# Patient Record
Sex: Female | Born: 1985
Health system: Southern US, Community
[De-identification: ages and names within clinical notes are randomized; demographics above are authoritative.]

## PROBLEM LIST (undated history)

## (undated) ENCOUNTER — Inpatient Hospital Stay (HOSPITAL_COMMUNITY): Payer: Self-pay

## (undated) DIAGNOSIS — B999 Unspecified infectious disease: Secondary | ICD-10-CM

## (undated) DIAGNOSIS — R87629 Unspecified abnormal cytological findings in specimens from vagina: Secondary | ICD-10-CM

## (undated) DIAGNOSIS — IMO0002 Reserved for concepts with insufficient information to code with codable children: Secondary | ICD-10-CM

## (undated) HISTORY — PX: NO PAST SURGERIES: SHX2092

---
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
## 2007-01-20 ENCOUNTER — Ambulatory Visit: Payer: Self-pay | Admitting: Sports Medicine

## 2007-04-21 ENCOUNTER — Ambulatory Visit: Payer: Self-pay | Admitting: Family Medicine

## 2007-05-03 ENCOUNTER — Ambulatory Visit: Payer: Self-pay | Admitting: Family Medicine

## 2007-05-03 ENCOUNTER — Encounter (INDEPENDENT_AMBULATORY_CARE_PROVIDER_SITE_OTHER): Payer: Self-pay | Admitting: Family Medicine

## 2007-05-03 ENCOUNTER — Telehealth: Payer: Self-pay | Admitting: *Deleted

## 2007-05-03 LAB — CONVERTED CEMR LAB
Bilirubin Urine: NEGATIVE
Glucose, Urine, Semiquant: NEGATIVE
Ketones, urine, test strip: NEGATIVE
Nitrite: NEGATIVE
Protein, U semiquant: 30
RBC / HPF: >20
Specific Gravity, Urine: 1.02
Urobilinogen, UA: 0.2
WBC, UA: >20 cells/hpf
pH: 7

## 2007-05-06 ENCOUNTER — Encounter (INDEPENDENT_AMBULATORY_CARE_PROVIDER_SITE_OTHER): Payer: Self-pay | Admitting: *Deleted

## 2007-06-02 ENCOUNTER — Encounter (INDEPENDENT_AMBULATORY_CARE_PROVIDER_SITE_OTHER): Payer: Self-pay | Admitting: Family Medicine

## 2007-06-02 ENCOUNTER — Ambulatory Visit: Payer: Self-pay | Admitting: Family Medicine

## 2007-06-02 LAB — CONVERTED CEMR LAB
Bilirubin Urine: NEGATIVE
Blood in Urine, dipstick: NEGATIVE
Chlamydia, DNA Probe: NEGATIVE
GC Probe Amp, Genital: NEGATIVE
Glucose, Urine, Semiquant: NEGATIVE
Ketones, urine, test strip: NEGATIVE
Nitrite: NEGATIVE
Protein, U semiquant: NEGATIVE
Specific Gravity, Urine: 1.025
Urobilinogen, UA: 0.2
WBC Urine, dipstick: NEGATIVE
Whiff Test: NEGATIVE
pH: 6

## 2007-06-03 ENCOUNTER — Encounter (INDEPENDENT_AMBULATORY_CARE_PROVIDER_SITE_OTHER): Payer: Self-pay | Admitting: Family Medicine

## 2007-07-13 ENCOUNTER — Encounter: Payer: Self-pay | Admitting: Family Medicine

## 2007-07-13 ENCOUNTER — Telehealth (INDEPENDENT_AMBULATORY_CARE_PROVIDER_SITE_OTHER): Payer: Self-pay | Admitting: *Deleted

## 2007-07-13 ENCOUNTER — Ambulatory Visit: Payer: Self-pay

## 2007-10-08 ENCOUNTER — Ambulatory Visit: Payer: Self-pay | Admitting: Family Medicine

## 2007-11-11 ENCOUNTER — Encounter: Payer: Self-pay | Admitting: *Deleted

## 2007-11-30 ENCOUNTER — Encounter: Payer: Self-pay | Admitting: Family Medicine

## 2007-11-30 ENCOUNTER — Other Ambulatory Visit: Admission: RE | Admit: 2007-11-30 | Discharge: 2007-11-30 | Payer: Self-pay | Admitting: Family Medicine

## 2007-11-30 ENCOUNTER — Ambulatory Visit: Payer: Self-pay | Admitting: Family Medicine

## 2007-11-30 LAB — CONVERTED CEMR LAB
Chlamydia, DNA Probe: NEGATIVE
GC Probe Amp, Genital: NEGATIVE
Whiff Test: NEGATIVE

## 2007-12-02 ENCOUNTER — Encounter: Payer: Self-pay | Admitting: Family Medicine

## 2007-12-02 LAB — CONVERTED CEMR LAB: Pap Smear: NORMAL

## 2007-12-03 ENCOUNTER — Encounter (INDEPENDENT_AMBULATORY_CARE_PROVIDER_SITE_OTHER): Payer: Self-pay | Admitting: Family Medicine

## 2008-01-03 ENCOUNTER — Ambulatory Visit: Payer: Self-pay | Admitting: Family Medicine

## 2008-04-04 ENCOUNTER — Ambulatory Visit: Payer: Self-pay | Admitting: Family Medicine

## 2008-04-04 LAB — CONVERTED CEMR LAB: Beta hcg, urine, semiquantitative: NEGATIVE

## 2008-08-02 ENCOUNTER — Ambulatory Visit: Payer: Self-pay | Admitting: Family Medicine

## 2008-08-02 LAB — CONVERTED CEMR LAB: Beta hcg, urine, semiquantitative: NEGATIVE

## 2008-12-25 ENCOUNTER — Ambulatory Visit: Payer: Self-pay | Admitting: Family Medicine

## 2008-12-25 ENCOUNTER — Encounter: Payer: Self-pay | Admitting: Family Medicine

## 2008-12-25 ENCOUNTER — Telehealth: Payer: Self-pay | Admitting: Family Medicine

## 2008-12-25 LAB — CONVERTED CEMR LAB
Beta hcg, urine, semiquantitative: NEGATIVE
Chlamydia, DNA Probe: NEGATIVE
GC Probe Amp, Genital: NEGATIVE
Whiff Test: NEGATIVE

## 2008-12-27 ENCOUNTER — Encounter: Payer: Self-pay | Admitting: Family Medicine

## 2009-01-12 ENCOUNTER — Encounter: Payer: Self-pay | Admitting: Family Medicine

## 2009-01-12 ENCOUNTER — Other Ambulatory Visit: Admission: RE | Admit: 2009-01-12 | Discharge: 2009-01-12 | Payer: Self-pay | Admitting: Family Medicine

## 2009-01-12 ENCOUNTER — Ambulatory Visit: Payer: Self-pay | Admitting: Family Medicine

## 2009-01-12 LAB — CONVERTED CEMR LAB
Beta hcg, urine, semiquantitative: NEGATIVE
Chlamydia, DNA Probe: NEGATIVE
GC Probe Amp, Genital: NEGATIVE
Whiff Test: POSITIVE

## 2009-01-23 ENCOUNTER — Encounter: Payer: Self-pay | Admitting: Family Medicine

## 2009-01-26 ENCOUNTER — Ambulatory Visit: Payer: Self-pay | Admitting: Family Medicine

## 2009-01-26 ENCOUNTER — Encounter: Payer: Self-pay | Admitting: Family Medicine

## 2009-01-26 LAB — CONVERTED CEMR LAB
ALT: 57 units/L — ABNORMAL HIGH (ref 0–35)
AST: 51 units/L — ABNORMAL HIGH (ref 0–37)
Albumin: 4.2 g/dL (ref 3.5–5.2)
Alkaline Phosphatase: 54 units/L (ref 39–117)
BUN: 7 mg/dL (ref 6–23)
Beta hcg, urine, semiquantitative: NEGATIVE
CO2: 21 meq/L (ref 19–32)
Calcium: 9.4 mg/dL (ref 8.4–10.5)
Chloride: 104 meq/L (ref 96–112)
Cholesterol: 196 mg/dL (ref 0–200)
Creatinine, Ser: 0.65 mg/dL (ref 0.40–1.20)
Glucose, Bld: 78 mg/dL (ref 70–99)
HDL: 58 mg/dL (ref >39)
LDL Cholesterol: 127 mg/dL — ABNORMAL HIGH (ref 0–99)
Potassium: 4 meq/L (ref 3.5–5.3)
Sodium: 138 meq/L (ref 135–145)
Total Bilirubin: 0.4 mg/dL (ref 0.3–1.2)
Total CHOL/HDL Ratio: 3.4
Total Protein: 7.5 g/dL (ref 6.0–8.3)
Triglycerides: 55 mg/dL (ref <150)
VLDL: 11 mg/dL (ref 0–40)

## 2009-01-29 ENCOUNTER — Telehealth: Payer: Self-pay | Admitting: *Deleted

## 2009-02-09 ENCOUNTER — Ambulatory Visit: Payer: Self-pay | Admitting: Family Medicine

## 2009-02-09 ENCOUNTER — Encounter (INDEPENDENT_AMBULATORY_CARE_PROVIDER_SITE_OTHER): Payer: Self-pay | Admitting: Family Medicine

## 2009-02-13 ENCOUNTER — Encounter (INDEPENDENT_AMBULATORY_CARE_PROVIDER_SITE_OTHER): Payer: Self-pay | Admitting: Family Medicine

## 2009-02-13 LAB — CONVERTED CEMR LAB
ALT: 18 units/L (ref 0–35)
AST: 15 units/L (ref 0–37)
Albumin: 4.4 g/dL (ref 3.5–5.2)
Alkaline Phosphatase: 53 units/L (ref 39–117)
Bilirubin, Direct: 0.1 mg/dL (ref 0.0–0.3)
HCV Ab: NEGATIVE
Hep B S Ab: POSITIVE — AB
Hepatitis B Surface Ag: NEGATIVE
Indirect Bilirubin: 0.2 mg/dL (ref 0.0–0.9)
Total Bilirubin: 0.3 mg/dL (ref 0.3–1.2)
Total Protein: 7.4 g/dL (ref 6.0–8.3)

## 2009-02-21 ENCOUNTER — Encounter: Payer: Self-pay | Admitting: Sports Medicine

## 2009-02-21 ENCOUNTER — Ambulatory Visit: Payer: Self-pay | Admitting: Family Medicine

## 2009-02-21 LAB — CONVERTED CEMR LAB
Beta hcg, urine, semiquantitative: NEGATIVE
Bilirubin Urine: NEGATIVE
Blood in Urine, dipstick: NEGATIVE
Chlamydia, DNA Probe: NEGATIVE
GC Probe Amp, Genital: NEGATIVE
Glucose, Urine, Semiquant: NEGATIVE
Ketones, urine, test strip: NEGATIVE
Nitrite: NEGATIVE
Protein, U semiquant: NEGATIVE
Specific Gravity, Urine: 1.015
Urobilinogen, UA: 0.2
WBC Urine, dipstick: NEGATIVE
Whiff Test: NEGATIVE
pH: 6

## 2009-03-01 ENCOUNTER — Ambulatory Visit: Payer: Self-pay | Admitting: Family Medicine

## 2009-03-02 ENCOUNTER — Encounter: Payer: Self-pay | Admitting: *Deleted

## 2009-03-02 ENCOUNTER — Encounter: Admission: RE | Admit: 2009-03-02 | Discharge: 2009-03-02 | Payer: Self-pay | Admitting: Family Medicine

## 2009-03-15 ENCOUNTER — Ambulatory Visit: Payer: Self-pay | Admitting: Family Medicine

## 2009-03-31 ENCOUNTER — Emergency Department (HOSPITAL_COMMUNITY): Admission: EM | Admit: 2009-03-31 | Discharge: 2009-03-31 | Payer: Self-pay | Admitting: Emergency Medicine

## 2009-04-10 ENCOUNTER — Telehealth: Payer: Self-pay | Admitting: Sports Medicine

## 2009-04-11 ENCOUNTER — Ambulatory Visit: Payer: Self-pay | Admitting: Family Medicine

## 2009-04-11 ENCOUNTER — Encounter: Payer: Self-pay | Admitting: Family Medicine

## 2009-04-12 LAB — CONVERTED CEMR LAB

## 2009-07-06 ENCOUNTER — Ambulatory Visit: Payer: Self-pay | Admitting: Family Medicine

## 2009-07-06 ENCOUNTER — Telehealth (INDEPENDENT_AMBULATORY_CARE_PROVIDER_SITE_OTHER): Payer: Self-pay | Admitting: *Deleted

## 2009-07-06 LAB — CONVERTED CEMR LAB: Whiff Test: NEGATIVE

## 2009-08-01 ENCOUNTER — Ambulatory Visit: Payer: Self-pay | Admitting: Family Medicine

## 2009-08-01 LAB — CONVERTED CEMR LAB: Whiff Test: NEGATIVE

## 2009-08-08 ENCOUNTER — Ambulatory Visit: Payer: Self-pay | Admitting: Family Medicine

## 2009-08-08 ENCOUNTER — Encounter: Payer: Self-pay | Admitting: Sports Medicine

## 2009-08-08 ENCOUNTER — Encounter: Admission: RE | Admit: 2009-08-08 | Discharge: 2009-08-08 | Payer: Self-pay | Admitting: Sports Medicine

## 2009-08-08 LAB — CONVERTED CEMR LAB
Basophils Absolute: 0 10*3/uL (ref 0.0–0.1)
Basophils Relative: 0 % (ref 0–1)
CMV IgM: <8 (ref <30.0)
Cytomegalovirus Ab-IgG: 5.9 — ABNORMAL HIGH (ref <0.4)
EBV VCA IgG: >7 — ABNORMAL HIGH
EBV VCA IgM: 0.17
Eosinophils Absolute: 0.1 10*3/uL (ref 0.0–0.7)
Eosinophils Relative: 1 % (ref 0–5)
HCT: 32 % — ABNORMAL LOW (ref 36.0–46.0)
Hemoglobin: 9.7 g/dL — ABNORMAL LOW (ref 12.0–15.0)
Lymphocytes Relative: 34 % (ref 12–46)
Lymphs Abs: 2.3 10*3/uL (ref 0.7–4.0)
MCHC: 30.3 g/dL (ref 30.0–36.0)
MCV: 73.7 fL — ABNORMAL LOW (ref 78.0–100.0)
Monocytes Absolute: 0.6 10*3/uL (ref 0.1–1.0)
Monocytes Relative: 8 % (ref 3–12)
Neutro Abs: 3.8 10*3/uL (ref 1.7–7.7)
Neutrophils Relative %: 57 % (ref 43–77)
Platelets: 363 10*3/uL (ref 150–400)
RBC: 4.34 M/uL (ref 3.87–5.11)
RDW: 16.4 % — ABNORMAL HIGH (ref 11.5–15.5)
Sed Rate: 20 mm/hr (ref 0–22)
Toxoplasma Antibody- IgM: 0.3
Toxoplasma IgG Ratio: <0.5
WBC: 6.7 10*3/uL (ref 4.0–10.5)

## 2009-08-22 ENCOUNTER — Ambulatory Visit: Payer: Self-pay | Admitting: Family Medicine

## 2009-08-22 LAB — CONVERTED CEMR LAB: Beta hcg, urine, semiquantitative: NEGATIVE

## 2009-08-23 ENCOUNTER — Telehealth: Payer: Self-pay | Admitting: Sports Medicine

## 2009-11-13 ENCOUNTER — Ambulatory Visit: Payer: Self-pay | Admitting: Family Medicine

## 2010-01-18 ENCOUNTER — Ambulatory Visit: Payer: Self-pay | Admitting: Family Medicine

## 2010-02-07 ENCOUNTER — Ambulatory Visit: Payer: Self-pay | Admitting: Family Medicine

## 2010-05-01 ENCOUNTER — Encounter: Payer: Self-pay | Admitting: *Deleted

## 2010-08-19 ENCOUNTER — Encounter: Payer: Self-pay | Admitting: Sports Medicine

## 2010-11-12 NOTE — Assessment & Plan Note (Signed)
Summary: No Show/Depo  Nurse Visit   Allergies: No Known Drug Allergies

## 2010-11-12 NOTE — Miscellaneous (Signed)
  Clinical Lists Changes  Problems: Removed problem of DENTAL PAIN (ICD-525.9) Removed problem of LYMPHADENOPATHY (ICD-785.6) Removed problem of BREAST PAIN, LEFT (ICD-611.71) Removed problem of PELVIC  PAIN (ICD-789.09) Removed problem of LIVER FUNCTION TESTS, ABNORMAL, HX OF (ICD-V12.2) Removed problem of ASCUS PAP (ICD-795.01) Removed problem of ROUTINE GYNECOLOGICAL EXAMINATION (ICD-V72.31) Removed problem of SCREENING FOR MALIGNANT NEOPLASM OF THE CERVIX (ICD-V76.2) Removed problem of CONTACT OR EXPOSURE TO OTHER VIRAL DISEASES (ICD-V01.79) Removed problem of CONTRACEPTIVE MANAGEMENT (ICD-V25.09)

## 2010-11-12 NOTE — Assessment & Plan Note (Signed)
 Summary: f/u u/s/eo   Vital Signs:  Patient profile:   25 year old female Weight:      139.7 pounds Temp:     98.1 degrees F Pulse rate:   67 / minute BP sitting:   99 / 68  Vitals Entered By: Letitia Reusing (March 15, 2009 3:50 PM) CC: f/u   Primary Care Provider:  LUDIE LITTLER MD  CC:  f/u.  History of Present Illness: 68F for fu of pelvic pain at previous visits.    Pain is now even better than previous visit.  Doesn't even bother pt now.  Had pelvic and transvaginal US  that was neg for any lesions.  Taking ibuprofen  and it still helps.  Neg GC/Chlam, UA, wet prep, KOH.  Description of pain from 5/10.  Described as sharp/stabbing pain that is worse in the mornings after her ibuprofen  has worn off.  It is present bilaterally, no radiation.  No known association with menses as she is on depo povera regularly, last injection 1.5 months ago and documented in our system.  Occasional hot flashes but no fevers.  No N/V/C/D, stooling two times a day and soft.  No blood in stool.  No vaginal discharge, no bleeding, no itching.  Pain is associated with urinary frequency.  When she does urinate, only a small amount comes out.  No burning or pain when she urinates.  No foul smell or changes in color.  Pt is sexually active with one female, uses condoms intermittently, never had an STD but has been tested before.  No known family history of endometritis or ovarian cysts.  Negative UA, Upreg, GC/Chlam and wet prep at initial visit.  Better with ibuprofen .  Allergies: No Known Drug Allergies  Past History:  Past Medical History: Last updated: 08/02/2008 H/o eczema  Past Surgical History: Last updated: 08/02/2008 None  Family History: Last updated: 08/02/2008 Mother and father living and healthy  Social History: Last updated: 01/12/2009 MARLOW Parsley, studying business. Occupation: Print Production Planner, sexually active Never Smoked Alcohol use-no Drug use-no Regular exercise-yes  Review  of Systems       See HPI  Physical Exam  General:  Well-developed,well-nourished,in no acute distress; alert,appropriate and cooperative throughout examination Lungs:  Normal respiratory effort, chest expands symmetrically. Lungs are clear to auscultation, no crackles or wheezes. Heart:  Normal rate and regular rhythm. S1 and S2 normal without gallop, murmur, click, rub or other extra sounds. Abdomen:  Bowel sounds positive,abdomen soft and non-tender without masses, organomegaly or hernias noted.   Impression & Recommendations:  Problem # 1:  PELVIC  PAIN (ICD-789.09) Assessment Improved Starting to appear more and more like menstrual cramping type pain.  Workup negative.  Still would consider endometritis if comes with periods however she has not had one since her depo shot.  She will come back in 2 months and let me know if pain happens with cycle.  No more depo, will call if any red flags.  Her updated medication list for this problem includes:    Ibuprofen  800 Mg Tabs (Ibuprofen ) ..... One tab by mouth q6h as needed for pain.  Orders: [facility]- Est Level  3 (00786)  Complete Medication List: 1)  Ibuprofen  800 Mg Tabs (Ibuprofen ) .... One tab by mouth q6h as needed for pain. 2)  Doc-q-lace 100 Mg Caps (Docusate sodium ) .... One tab by mouth bid  Patient Instructions: 1)  Great to see you today, 2)  Glad to hear that the pain is less and not  bothering you anymore.  Keep taking the ibuprofen  and start taking the stool softener. 3)  Come back to see me in 2 months so that we can see if the pain has started to come and go with your menstrual cycles.  Do not get any more Depo provera  shots for now. 4)  Come back earlier if you have severe pain, severe bleeding (not like a period) or any other major concerns. 5)  -Dr. ONEIDA.

## 2010-11-12 NOTE — Assessment & Plan Note (Signed)
 Summary: uti s/s   Vital Signs:  Patient profile:   25 year old female Weight:      140.2 pounds Temp:     98.0 degrees F oral Pulse rate:   71 / minute BP sitting:   102 / 66  (left arm)  Vitals Entered By: Letitia Reusing (Feb 21, 2009 3:42 PM) CC: Pelvic pain, urinary freq Is Patient Diabetic? No   History of Present Illness: 71 F comes in with complaints of pelvic pain.  Pain has been going on for 3 days now, has never had pain similar to this before  5/10.  Described as sharp/stabbing pain that is worse in the mornings after her ibuprofen  has worn off.  It is present bilaterally, no radiation.  No known association with menses as she is on depo povera regularly, last injection 1 month ago and documented in our system.  Occasional hot flashes but no fevers.  No N/V/C/D, stooling two times a day and soft.  No blood in stool.  No vaginal discharge, no bleeding, no itching.  Pain is associated with urinary frequency.  When she does urinate, only a small amount comes out.  No burning or pain when she urinates.  No foul smell or changes in color.  Pt is sexually active with one female, uses condoms intermittently, never had an STD but has been tested before.  No known family history of endometritis or ovarian cysts.  Habits & Providers     Tobacco Status: never  Allergies: No Known Drug Allergies  Past History:  Past Medical History:    H/o eczema (08/02/2008)  Past Surgical History:    None (08/02/2008)  Family History:    Mother and father living and healthy (08/02/2008)  Social History:    MARLOW Parsley, studying business.    Occupation: consulting civil engineer    Single, sexually active    Never Smoked    Alcohol use-no    Drug use-no    Regular exercise-yes     (01/12/2009)  Physical Exam  General:  Well-developed,well-nourished,in no acute distress; alert,appropriate and cooperative throughout examination Lungs:  Normal respiratory effort, chest expands symmetrically. Lungs are  clear to auscultation, no crackles or wheezes. Heart:  Normal rate and regular rhythm. S1 and S2 normal without gallop, murmur, click, rub or other extra sounds. Abdomen:  Bowel sounds positive,abdomen soft and without masses, organomegaly or hernias noted.  Mildly tender in LLQ and suprapubic, stool felt in LLQ and suprapubic.  No rebound tenderness.   Genitalia:  Normal introitus for age, no external lesions, no vaginal discharge, mucosa pink and moist, no vaginal or cervical lesions, no vaginal atrophy, no friaility or hemorrhage, normal uterus size and position, no adnexal masses or tenderness Extremities:  No clubbing, cyanosis, edema, or deformity noted with normal full range of motion of all joints.   Skin:  Intact without suspicious lesions or rashes   Impression & Recommendations:  Problem # 1:  PELVIC  PAIN (ICD-789.09) Differential is quite wide.  Negative wet prep, Urinalysis, pelvic exam, neg pregnancy test and no association with menses.  Differentials include Ovarian cysts, mittelschmerz, endomeritis, constipation, PID.   Unlikely Cystitis with negative UA.  With complaints of frequency/ugency, also should consider urge incontinence/overactve bladder even though she has not had any leakage.  Will have her come back in one week to see me to reasses pain.  This will also give time for the GC/Chlam to come back.  If she is still having pain, wlll start a  stool softener and get a pelvic/transvaginal ultrasound.  Would also stop Depo as it confounds whether this is associated with her menses.  Orders: U Preg-[facility] (81025) [facility]- Est  Level 4 (99214)  Other Orders: Urinalysis-[facility] (00000) GC/Chlamydia-[facility] (87591/87491) [facility] [phone])  Patient Instructions: 1)  Great to meet you today, 2)  Because some of the tests can take some time to come back, I want you to come back to see me NEXT WEEK as it can take a couple of days for the results to come back.  We will go over your results.   If you are still having pain then we can check the ultrasound at that time.  Keep taking the ibuprofen  for your symptoms for now. 3)  -Dr. ONEIDA.  Laboratory Results   Urine Tests  Date/Time Received: Feb 21, 2009 3:42 PM  Date/Time Reported: Feb 21, 2009 3:49 PM   Routine Urinalysis   Color: yellow Appearance: Clear Glucose: negative   (Normal Range: Negative) Bilirubin: negative   (Normal Range: Negative) Ketone: negative   (Normal Range: Negative) Spec. Gravity: 1.015   (Normal Range: 1.003-1.035) Blood: negative   (Normal Range: Negative) pH: 6.0   (Normal Range: 5.0-8.0) Protein: negative   (Normal Range: Negative) Urobilinogen: 0.2   (Normal Range: 0-1) Nitrite: negative   (Normal Range: Negative) Leukocyte Esterace: negative   (Normal Range: Negative)    Urine HCG: negative Comments: ...........test performed by...........SABRAArland Morel, CMA  Date/Time Received: Feb 21, 2009 4:32 PM  Date/Time Reported: Feb 21, 2009 4:39 PM   [employer] Source: vaginal WBC/hpf: 1-5 Bacteria/hpf: 3+  Rods Clue cells/hpf: none  Negative whiff Yeast/hpf: none Trichomonas/hpf: none Comments: ...........test performed by............SABRALucita Hinds MD 4:33PM

## 2010-11-12 NOTE — Assessment & Plan Note (Signed)
Summary: depo,df  Nurse Visit   Allergies: No Known Drug Allergies  Medication Administration  Injection # 1:    Medication: Depo-Provera 150mg     Diagnosis: CONTRACEPTIVE MANAGEMENT (ICD-V25.09)    Route: IM    Site: RUOQ gluteus    Exp Date: 02/2012    Lot #: [id number]    Mfr: [email or web address]    Comments: next depo due July 14 thru May 09, 2010    Patient tolerated injection without complications    Given by: Theresia Lo RN (February 07, 2010 2:44 PM)  Orders Added: 1)  Depo-Provera 150mg  [J1055] 2)  Admin of Injection (IM/SQ) [04540]   Medication Administration  Injection # 1:    Medication: Depo-Provera 150mg     Diagnosis: CONTRACEPTIVE MANAGEMENT (ICD-V25.09)    Route: IM    Site: RUOQ gluteus    Exp Date: 02/2012    Lot #: [id number]    Mfr: [email or web address]    Comments: next depo due July 14 thru May 09, 2010    Patient tolerated injection without complications    Given by: Theresia Lo RN (February 07, 2010 2:44 PM)  Orders Added: 1)  Depo-Provera 150mg  [J1055] 2)  Admin of Injection (IM/SQ) [47829]

## 2010-11-12 NOTE — Assessment & Plan Note (Signed)
Summary: ? infected tooth,tcb   Vital Signs:  Patient profile:   25 year old female Weight:      146.5 pounds Temp:     98.6 degrees F oral Pulse rate:   88 / minute BP sitting:   103 / 64  (right arm) Cuff size:   regular  Vitals Entered By: Loralee Pacas CMA (January 18, 2010 3:24 PM) CC: infected tooth   Primary Care Provider:  Rodney Langton MD  CC:  infected tooth.  History of Present Illness: 25 y/o female with 4 days of dental pain. s/p removal of all wisdom teeth last year. +TTP, facial swelling. no fever/chills. some difficulty eating solids. tried 800 mg ibuprofen with some relief of pain. dental appt scheduled 4/21. pain at R jaw, last molar.   Current Medications (verified): 1)  Ibuprofen 800 Mg Tabs (Ibuprofen) .... One Tab By Mouth Q6h As Needed For Pain. 2)  Doc-Q-Lace 100 Mg Caps (Docusate Sodium) .... One Tab By Mouth Bid 3)  Penicillin V Potassium 500 Mg Tabs (Penicillin V Potassium) .... One Tab By Mouth Qid X10 Days. 4)  Ultram 50 Mg Tabs (Tramadol Hcl) .... One Tab By Mouth Q6 Hours As Needed Pain  Allergies (verified): No Known Drug Allergies  Physical Exam  General:  well appearing female NAD. vitals reviewed.  Mouth:  good dentition.  +erythema and exquisite TTP near buccal surface of R mandibular molar. mild edema of face near area of molar.    Impression & Recommendations:  Problem # 1:  DENTAL PAIN (ICD-525.9) Assessment New  concerning for infection. rx for penicillin and ultram for pain. red flags given.   Orders: [facility]- Est Level  3 (16109)  Patient Instructions: 1)  If you develop fever, worsening facial swelling or redness, or other concerns, please call our office 2)  Keep your appointment with the dentist Prescriptions: ULTRAM 50 MG TABS (TRAMADOL HCL) one tab by mouth q6 hours as needed pain  #100 x 0   Entered and Authorized by:   Lequita Asal  MD   Signed by:   Lequita Asal  MD on 01/18/2010   Method used:    Electronically to        [id number]* (retail)       [address] [place], [place]  [address]       Ph: [phone]       Fax: [phone]   RxID:   [id number] PENICILLIN V POTASSIUM 500 MG TABS (PENICILLIN V POTASSIUM) one tab by mouth qid x10 days.  #40 x 0   Entered and Authorized by:   Lequita Asal  MD   Signed by:   Lequita Asal  MD on 01/18/2010   Method used:   Electronically to        [id number]* (retail)       [address] [place], [place]  [address]       Ph: [phone]       Fax: [phone]   RxID:   [id number]

## 2010-11-12 NOTE — Assessment & Plan Note (Signed)
 Summary: f/u visit & knot under throat/[facility]   Vital Signs:  Patient profile:   25 year old female Height:      67 inches Weight:      140 pounds BMI:     22.01 Temp:     98.4 degrees F Pulse rate:   80 / minute BP sitting:   103 / 69  (right arm) Cuff size:   regular  Vitals Entered By: Nathanel Saba RN (August 08, 2009 3:13 PM) CC: POssible cyst under chin Is Patient Diabetic? No Pain Assessment Patient in pain? no        Primary Care Provider:  Debby Petties MD  CC:  POssible cyst under chin.  History of Present Illness: 66F with no sig PMHx here for contraceptive mgt and new lesion under chin.  Contraceptive mgt:  last depo shot was 4/10, was due for another 7/10 but missed, will need to check upreg today and in 2 weeks, then can give depo.  Lesion under chin:  history briefly, this patient has had recurrent vaginal candidiasis over 2 years, recurrent lymphadenopathy in both groins, and several cervical sites, and now just under the chin over the past 6 months.  LAD was initially painful and then non-tender, and nodes have always been freely mobile.  She also had an episode of a desquamative rash on her palms and soles several months ago but had a negative RPR.  She has never had fevers/chills, no night sweats, no weight loss, no cough, no fatigue.  Lymph nodes have never drained.  In the past she has had Hep B and C titers done that were negative, HIV that was neg in 2008, multiple positive wet preps (for fungus) that were tx with diflucan , flagyl for BV, a CMET that was negative for any abnormalities or hyperglycemia.  The lesion today does not bother her, she just is curious as to what it is.   There is no family history of immune problems, blood cancer, or sarcoid.  She is sexually active with oen partner but does have unprotected sex.  Habits & Providers  Alcohol-Tobacco-Diet     Tobacco Status: never     Tobacco Counseling: not indicated; no tobacco  use  Allergies: No Known Drug Allergies  Past History:  Past Medical History: Last updated: 08/02/2008 H/o eczema  Past Surgical History: Last updated: 08/02/2008 None  Family History: Last updated: 08/02/2008 Mother and father living and healthy  Social History: Last updated: 01/12/2009 MARLOW Parsley, studying business. Occupation: Print Production Planner, sexually active Never Smoked Alcohol use-no Drug use-no Regular exercise-yes  Review of Systems       See HPI  Physical Exam  General:  Well-developed,well-nourished,in no acute distress; alert,appropriate and cooperative throughout examination Head:  Normocephalic and atraumatic without obvious abnormalities. No apparent alopecia or balding. Eyes:  No corneal or conjunctival inflammation noted. EOMI. Perrla. Ears:  External ear exam shows no significant lesions or deformities.   Nose:  External nasal examination shows no deformity or inflammation. Mouth:  Whitish plaque over tongue but not on sides of mouth. No pharyngeal erythema Neck:  No deformities, masses, or tenderness noted.  See lymph node exam. Lungs:  Normal respiratory effort, chest expands symmetrically. Lungs are clear to auscultation, no crackles or wheezes. Heart:  Normal rate and regular rhythm. S1 and S2 normal without gallop, murmur, click, rub or other extra sounds. Abdomen:  Bowel sounds positive,abdomen soft and non-tender without masses, organomegaly or hernias noted. Extremities:  No clubbing, cyanosis, edema,  or deformity noted with normal full range of motion of all joints.   Skin:  Intact without suspicious lesions or rashes Cervical Nodes:  Several sites of non-tender lymph nodes palpable, both L and R jugulodigastric nodes palpable, cystic masses approx 1cm across under the chin immediately to the left and right of midline that doesn't move with tongue protrusion.  One palpable 7mm node right, posterior cervical chain.  Soft, well defined, and  mobile. Axillary Nodes:  No palpable lymphadenopathy Inguinal Nodes:  Left and right, 5mm palpable, non-tender nodes.  Soft, well defined, and mobile. Additional Exam:  KOH swab of whitish plaque on tongue is negative.   Impression & Recommendations:  Problem # 1:  LYMPHADENOPATHY (ICD-785.6) Assessment New Large Ddx includes HIV, syphilis, CMV, EBV, Toxo, Hodgkins vs Follicular/non-Hodgkins lymphoma, sarcoidosis.  Will test initially for all of these with the below labs.  Could be reactive nodes but would prefer to r/o more serious etiologies.  No B-symptoms.  Orders: CBC w/Diff-[facility] (14974) HIV-[facility] ([id number]) KOH-[facility] (12779) Sed Rate (ESR)-[facility] [id number]) Miscellaneous Lab Charge-[facility] [phone]) (CMV, EBV, Toxo IgG and IgM) RPR-[facility] ([id number]) CXR- 2view (CXR) [facility]- Est  Level 4 (00785)  Problem # 2:  CONTRACEPTIVE MANAGEMENT (ICD-V25.09) Assessment: Unchanged  Checking Upreg today, in 2 weeks as well, and if neg can do depo provera  150mg  IM x1 q3months.  Orders: U Preg-[facility] (81025)  Complete Medication List: 1)  Ibuprofen  800 Mg Tabs (Ibuprofen ) .... One tab by mouth q6h as needed for pain. 2)  Doc-q-lace 100 Mg Caps (Docusate sodium ) .... One tab by mouth bid  Patient Instructions: 1)  Good to see you today, 2)  I will be checking some more bloodwork and a chest Xray. 3)  Come back to see me as needed.  I will call you if anything is abnormal on your labs, some of the labs could take 2 weeks to come back. 4)  -Dr. ONEIDA.  Appended Document: lab reports    Lab Visit  Laboratory Results   Urine Tests  Date/Time Received: August 08, 2009 3:51 PM  Date/Time Reported: August 08, 2009 4:35 PM     Urine HCG: negative Comments: ...............test performed by......SABRABonnie A. Jordan, [place] (ASCP)  Date/Time Received: August 08, 2009 3:51 PM  Date/Time Reported: August 08, 2009 4:35 PM   Other Tests  Skin KOH: Negative Comments: ...............test performed  by......SABRABonnie A. Jordan, MT (ASCP)   Orders Today:

## 2010-11-12 NOTE — Progress Notes (Signed)
 Summary: Triage  Phone Note Call from Patient Call back at Home Phone [phone]   Caller: Patient Summary of Call: chest pain for about a week and dark spots on legs.   Initial call taken by: Madelin Daring,  April 10, 2009 2:16 PM  Follow-up for Phone Call        c/o chest pain worse in 1 week. comes & goes. located top of  L breast. nothing makes it better or worse. has not taken any meds for this. lasts 2 minutes. has not been seen for this before non-smoker c/o dark spots on upper legs x 3 weeks. denies bug bites. only on thigh area of legs. no pain. reviewed signs of MI with her & told her if any occur, call 911 & go to ED. appt made for 10am tomorrow Follow-up by: Ginnie Mau RN,  April 10, 2009 2:36 PM  Additional Follow-up for Phone Call Additional follow up Details #1::        Good, I am doing work-ins tomorrow so hopefully I can see her. Additional Follow-up by: Debby Petties MD,  April 10, 2009 11:07 PM

## 2010-11-12 NOTE — Progress Notes (Signed)
 Summary: triage  Phone Note Call from Patient Call back at [phone]   Caller: Patient Summary of Call: pt wants to get something called in for yeast inf.  Pt uses [facility]  Initial call taken by: Madelin Daring,  August 23, 2009 4:33 PM  Follow-up for Phone Call        offered appt. she states md told her to call if it re-occured & he would send more meds to pharmacy. to pcp Follow-up by: Ginnie Mau RN,  August 24, 2009 8:50 AM  Additional Follow-up for Phone Call Additional follow up Details #1::        Will do chronic suppressive therapy. Please notify patient. Additional Follow-up by: Debby Petties MD,  August 24, 2009 12:09 PM    New/Updated Medications: DIFLUCAN  150 MG TABS (FLUCONAZOLE ) One tab by mouth q72h x 3 doses, then qweekly for 6 months. Prescriptions: DIFLUCAN  150 MG TABS (FLUCONAZOLE ) One tab by mouth q72h x 3 doses, then qweekly for 6 months.  #27 x 0   Entered and Authorized by:   Debby Petties MD   Signed by:   Debby Petties MD on 08/24/2009   Method used:   Electronically to        [id number]* (retail)       [address] [place], [place]  [address]       Ph: [phone]       Fax: [phone]   RxID:   [id number]   Appended Document: triage pt informed

## 2010-11-12 NOTE — Assessment & Plan Note (Signed)
 Summary: 1:30 f/u per Dr. ONEIDA.   Vital Signs:  Patient profile:   25 year old female Weight:      139.9 pounds Temp:     97.5 degrees F oral Pulse rate:   88 / minute BP sitting:   111 / 71  (left arm)  Vitals Entered By: Letitia Reusing (Mar 01, 2009 1:39 PM)  CC: f/u Is Patient Diabetic? No   History of Present Illness: 74 F comes in for follow up of complaints of pelvic pain.  Symptoms similar to before but slightly better, she had her period and the pain started to get a little better.  Quality from previous exam briefly:   5/10.  Described as sharp/stabbing pain that is worse in the mornings after her ibuprofen  has worn off.  It is present bilaterally, no radiation.  No known association with menses as she is on depo povera regularly, last injection 1.5 months ago and documented in our system.  Occasional hot flashes but no fevers.  No N/V/C/D, stooling two times a day and soft.  No blood in stool.  No vaginal discharge, no bleeding, no itching.  Pain is associated with urinary frequency.  When she does urinate, only a small amount comes out.  No burning or pain when she urinates.  No foul smell or changes in color.  Pt is sexually active with one female, uses condoms intermittently, never had an STD but has been tested before.  No known family history of endometritis or ovarian cysts.  Negative UA, Upreg, GC/Chlam and wet prep at initial visit.  Better with ibuprofen .  Habits & Providers     Tobacco Status: never  Allergies: No Known Drug Allergies  Past History:  Past Medical History:    H/o eczema (08/02/2008)  Past Surgical History:    None (08/02/2008)  Family History:    Mother and father living and healthy (08/02/2008)  Social History:    Susan Solomon, studying business.    Occupation: consulting civil engineer    Single, sexually active    Never Smoked    Alcohol use-no    Drug use-no    Regular exercise-yes     (01/12/2009)  Review of Systems       See HPI  Physical  Exam  General:  Well-developed,well-nourished,in no acute distress; alert,appropriate and cooperative throughout examination Abdomen:  Bowel sounds positive,abdomen soft and without masses, organomegaly or hernias noted.  TTP RLQ and LLQ no rebounding, no guarding.   Impression & Recommendations:  Problem # 1:  PELVIC  PAIN (ICD-789.09) Assessment Improved Pain similar to last visit.  Workup will continue with pelvic/transvaginal ultrasound.  If negative will do pelvic MRI.  Pt to stop depo-provera  so as not to confuse symptoms with menstruation.  Will refill ibuprofen .  RTC one week.  Her updated medication list for this problem includes:    Ibuprofen  800 Mg Tabs (Ibuprofen ) ..... One tab by mouth q6h as needed for pain.  Orders: [facility]- Est Level  3 (00786) Ultrasound (Ultrasound)  Complete Medication List: 1)  Ibuprofen  800 Mg Tabs (Ibuprofen ) .... One tab by mouth q6h as needed for pain. 2)  Doc-q-lace 100 Mg Caps (Docusate sodium ) .... One tab by mouth bid  Patient Instructions: 1)  Great to see you today, 2)  Because you are still having the pain, I'd like to check an ultrasound.  My nurse will give you the information to make an appointment to get the US . 3)  Please stop your Depo povera for  now so that we can start to time it with your periods. 4)  I will refill your ibuprofin. 5)  See me again in one week to go over results. 6)  -Dr. ONEIDA. Prescriptions: DOC-Q-LACE 100 MG CAPS (DOCUSATE SODIUM ) One tab by mouth BID  #60 x 3   Entered and Authorized by:   Debby Petties MD   Signed by:   Debby Petties MD on 03/01/2009   Method used:   Electronically to        [facility] [address] [id number]* (retail)       [address] [place], [place]  [address]       Ph: [phone]       Fax: [phone]   RxID:   [id number] IBUPROFEN  800 MG TABS (IBUPROFEN ) One tab by mouth q6h as needed for pain.  #60 x 3   Entered and Authorized by:   Debby Petties MD    Signed by:   Debby Petties MD on 03/01/2009   Method used:   Electronically to        [facility] [address] [id number]* (retail)       [address] [place], [place]  [address]       Ph: [phone]       Fax: [phone]   RxID:   [id number]

## 2010-11-12 NOTE — Assessment & Plan Note (Signed)
Summary: depo,tcb  Nurse Visit   Allergies: No Known Drug Allergies  Medication Administration  Injection # 1:    Medication: Depo-Provera 150mg     Diagnosis: CONTRACEPTIVE MANAGEMENT (ICD-V25.09)    Route: IM    Site: LUOQ gluteus    Exp Date: 01/2012    Lot #: [id number]    Mfr: [email or web address]    Comments: next depo due April 19 thru Feb 12, 2010    Patient tolerated injection without complications    Given by: Theresia Lo RN (November 13, 2009 9:47 AM)  Orders Added: 1)  Depo-Provera 150mg  [J1055] 2)  Admin of Injection (IM/SQ) [04540]   Medication Administration  Injection # 1:    Medication: Depo-Provera 150mg     Diagnosis: CONTRACEPTIVE MANAGEMENT (ICD-V25.09)    Route: IM    Site: LUOQ gluteus    Exp Date: 01/2012    Lot #: [id number]    Mfr: [email or web address]    Comments: next depo due April 19 thru Feb 12, 2010    Patient tolerated injection without complications    Given by: Theresia Lo RN (November 13, 2009 9:47 AM)  Orders Added: 1)  Depo-Provera 150mg  [J1055] 2)  Admin of Injection (IM/SQ) [47829]

## 2013-03-29 DIAGNOSIS — IMO0002 Reserved for concepts with insufficient information to code with codable children: Secondary | ICD-10-CM

## 2013-03-29 DIAGNOSIS — R87619 Unspecified abnormal cytological findings in specimens from cervix uteri: Secondary | ICD-10-CM

## 2013-03-29 HISTORY — DX: Unspecified abnormal cytological findings in specimens from cervix uteri: R87.619

## 2013-03-29 HISTORY — DX: Reserved for concepts with insufficient information to code with codable children: IMO0002

## 2013-04-14 ENCOUNTER — Telehealth (HOSPITAL_COMMUNITY): Payer: Self-pay | Admitting: *Deleted

## 2013-04-14 NOTE — Telephone Encounter (Signed)
Telephoned patient at home # and left message to return call to [facility] 

## 2013-05-03 ENCOUNTER — Encounter (HOSPITAL_COMMUNITY): Payer: Self-pay

## 2013-05-03 ENCOUNTER — Ambulatory Visit (HOSPITAL_COMMUNITY)
Admission: RE | Admit: 2013-05-03 | Discharge: 2013-05-03 | Disposition: A | Discharge status: Home / Self Care | Payer: Self-pay | Source: Ambulatory Visit | Attending: Obstetrics and Gynecology | Admitting: Obstetrics and Gynecology

## 2013-05-03 VITALS — BP 120/60 | Temp 98.0°F | Ht 67.0 in | Wt 140.4 lb

## 2013-05-03 DIAGNOSIS — R87810 Cervical high risk human papillomavirus (HPV) DNA test positive: Secondary | ICD-10-CM | POA: Insufficient documentation

## 2013-05-03 DIAGNOSIS — R87610 Atypical squamous cells of undetermined significance on cytologic smear of cervix (ASC-US): Secondary | ICD-10-CM | POA: Insufficient documentation

## 2013-05-03 DIAGNOSIS — Z1239 Encounter for other screening for malignant neoplasm of breast: Secondary | ICD-10-CM

## 2013-05-03 HISTORY — DX: Reserved for concepts with insufficient information to code with codable children: IMO0002

## 2013-05-03 NOTE — Progress Notes (Signed)
Patient referred to [facility] by the [facility] for follow up for an abnormal Pap smear on 03/29/2013. Patient complained of occasional mild left breast pain once or twice a month.   Pap Smear:    Pap smear not completed today. Last Pap smear was 03/29/2013 at the [facility] Department and ASCUS HPV+. Referred patient to the [facility] for colposcopy for follow up of abnormal Pap smear on 03/29/2013. Appointment scheduled for Monday, May 30, 2013 at 1515. Per patient this was her first abnormal Pap smear.  Pap smear result is scanned in EPIC under media.  Physical exam: Breasts Breasts symmetrical. No skin abnormalities bilateral breasts. No nipple retraction bilateral breasts. Bilateral nipple piercing's. No nipple discharge bilateral breasts. No lymphadenopathy. No lumps palpated bilateral breasts. No complaints of pain or tenderness on exam. Patient complained of occasional left breast pain once or twice a month. Patient informed to call if pain gets worse or happens more frequently. Screening mammogram recommended at age 5 unless clinically indicated.  Pelvic/Bimanual No Pap smear completed today since last Pap smear was 03/29/2013. Pap smear not indicated per [facility] guidelines.

## 2013-05-03 NOTE — Patient Instructions (Addendum)
Taught Susan Solomon how to perform BSE and gave educational materials to take home. Patient did not need a Pap smear today due to last Pap smear was 03/29/2013. Referred patient to the [facility] for colposcopy for follow up of abnormal Pap smear on 03/29/2013. Appointment scheduled for Monday, May 30, 2013 at 1515. Patient aware of appointment and will be there. Susan Solomon verbalized understanding.  Susan Solomon, Susan Maser, RN 2:11 PM

## 2013-05-30 ENCOUNTER — Encounter: Payer: Self-pay | Admitting: *Deleted

## 2013-05-30 ENCOUNTER — Ambulatory Visit (INDEPENDENT_AMBULATORY_CARE_PROVIDER_SITE_OTHER): Payer: Self-pay | Admitting: Obstetrics & Gynecology

## 2013-05-30 ENCOUNTER — Encounter: Payer: Self-pay | Admitting: Obstetrics & Gynecology

## 2013-05-30 ENCOUNTER — Other Ambulatory Visit (HOSPITAL_COMMUNITY)
Admission: RE | Admit: 2013-05-30 | Discharge: 2013-05-30 | Disposition: A | Discharge status: Home / Self Care | Payer: Self-pay | Source: Ambulatory Visit | Attending: Obstetrics & Gynecology | Admitting: Obstetrics & Gynecology

## 2013-05-30 VITALS — BP 119/64 | HR 89 | Temp 97.5°F | Ht 67.0 in | Wt 141.7 lb

## 2013-05-30 DIAGNOSIS — Z01812 Encounter for preprocedural laboratory examination: Secondary | ICD-10-CM

## 2013-05-30 DIAGNOSIS — R87610 Atypical squamous cells of undetermined significance on cytologic smear of cervix (ASC-US): Secondary | ICD-10-CM

## 2013-05-30 DIAGNOSIS — R87810 Cervical high risk human papillomavirus (HPV) DNA test positive: Secondary | ICD-10-CM

## 2013-05-30 DIAGNOSIS — R87619 Unspecified abnormal cytological findings in specimens from cervix uteri: Secondary | ICD-10-CM | POA: Insufficient documentation

## 2013-05-30 LAB — POCT PREGNANCY, URINE: Preg Test, Ur: NEGATIVE

## 2013-05-30 NOTE — Progress Notes (Signed)
Patient ID: DUHA Solomon, female   DOB: Feb 08, 1986, 27 y.o.   MRN: [id number]  Chief Complaint  Patient presents with  . Procedure    colpo    HPI [name] is a 27 y.o. female.  Referred for colposcopy with pap at [facility] c/w ASCUS pos. HR HPV. H/O ASCUS neg HPV 2010  HPI  Indications: Pap smear on June 2014 showed: ASCUS with POSITIVE high risk HPV. Previous colposcopy: none. Prior cervical treatment: no treatment.  Past Medical History  Diagnosis Date  . Abnormal Pap smear 03/29/2013    ASC-US +HPV    No past surgical history on file.  Family History  Problem Relation Age of Onset  . Cancer Father     colon  . Cancer Paternal Aunt     colon  . Cancer Paternal Uncle     colon    Social History History  Substance Use Topics  . Smoking status: Never Smoker   . Smokeless tobacco: Never Used  . Alcohol Use: No    No Known Allergies  Current Outpatient Prescriptions  Medication Sig Dispense Refill  . levonorgestrel-ethinyl estradiol (NORDETTE) 0.15-30 MG-MCG tablet Take 1 tablet by mouth daily.      Marland Kitchen docusate sodium (DOCQLACE) 100 MG capsule Take 100 mg by mouth 2 (two) times daily.        Marland Kitchen ibuprofen (ADVIL,MOTRIN) 800 MG tablet Take 800 mg by mouth every 6 (six) hours as needed.        . traMADol (ULTRAM) 50 MG tablet Take 50 mg by mouth every 6 (six) hours as needed.         No current facility-administered medications for this visit.    Review of Systems Review of Systems  Blood pressure 119/64, pulse 89, temperature 97.5 F (36.4 C), temperature source Oral, height 5\' 7"  (1.702 m), weight 141 lb 11.2 oz (64.275 kg), last menstrual period 05/25/2013.  Physical Exam Physical Exam  Data Reviewed Previous pap test, [facility] note  Assessment    Procedure Details  The risks and benefits of the procedure and Written informed consent obtained.  Speculum placed in vagina and excellent visualization of cervix achieved, cervix swabbed x 3 with acetic  acid solution. AWE at TZ from 900 to 200, vaginal HPV changes as well Specimens: ECC, bx at 1100  Complications: none.   Imp  Suspect LSIL, vaginal warts  Plan    Specimens labelled and sent to Pathology. Return to discuss Pathology results in 2 weeks.      [name] 05/30/2013, 4:29 PM

## 2013-05-30 NOTE — Patient Instructions (Signed)
HPV Test The HPV (human papillomavirus) test is used to screen for high-risk types with HPV infection. HPV is a group of about 100 related viruses, of which 40 types are genital viruses. Most HPV viruses cause infections that usually resolve without treatment within 2 years. Some HPV infections can cause skin and genital warts (condylomata). HPV types 16, 18, 31 and 45 are considered high-risk types of HPV. High-risk types of HPV do not usually cause visible warts, but if untreated, may lead to cancers of the outlet of the womb (cervix) or anus. An HPV test identifies the DNA (genetic) strands of the HPV infection. Because the test identifies the DNA strands, the test is also referred to as the HPV DNA test. Although HPV is found in both males and females, the HPV test is only used to screen for cervical cancer in females. This test is recommended for females:  With an abnormal Pap test.  After treatment of an abnormal Pap test.  Aged 30 and older.  After treatment of a high-risk HPV infection. The HPV test may be done at the same time as a Pap test in females over the age of 30. Both the HPV and Pap test require a sample of cells from the cervix. PREPARATION FOR TEST  You may be asked to avoid douching, tampons, or vaginal medicines for 48 hours before the HPV test. You will be asked to urinate before the test. For the HPV test, you will need to lie on an exam table with your feet in stirrups. A spatula will be inserted into the vagina. The spatula will be used to swab the cervix for a cell and mucus sample. The sample will be further evaluated in a lab under a microscope. NORMAL FINDINGS  Normal: High-risk HPV is not found.  Ranges for normal findings may vary among different laboratories and hospitals. You should always check with your doctor after having lab work or other tests done to discuss the meaning of your test results and whether your values are considered within normal limits. MEANING  OF TEST An abnormal HPV test means that high-risk HPV is found. Your caregiver may recommend further testing. Your caregiver will go over the test results with you. He or she will and discuss the importance and meaning of your results, as well as treatment options and the need for additional tests, if necessary. OBTAINING THE RESULTS  It is your responsibility to obtain your test results. Ask the lab or department performing the test when and how you will get your results. Document Released: 10/24/2004 Document Revised: 12/22/2011 Document Reviewed: 07/09/2005 ExitCare Patient Information 2014 [employer].  

## 2013-06-03 ENCOUNTER — Encounter: Payer: Self-pay | Admitting: *Deleted

## 2013-06-27 ENCOUNTER — Encounter: Payer: Self-pay | Admitting: *Deleted

## 2013-07-14 ENCOUNTER — Telehealth (HOSPITAL_COMMUNITY): Payer: Self-pay | Admitting: *Deleted

## 2013-07-14 NOTE — Telephone Encounter (Signed)
Discussed results of colposcopy

## 2013-11-01 ENCOUNTER — Other Ambulatory Visit: Payer: Self-pay

## 2013-11-01 DIAGNOSIS — N83209 Unspecified ovarian cyst, unspecified side: Secondary | ICD-10-CM | POA: Insufficient documentation

## 2013-12-05 LAB — OB RESULTS CONSOLE RUBELLA ANTIBODY, IGM: Rubella: IMMUNE

## 2013-12-05 LAB — OB RESULTS CONSOLE ANTIBODY SCREEN: Antibody Screen: NEGATIVE

## 2013-12-05 LAB — OB RESULTS CONSOLE GC/CHLAMYDIA
Chlamydia: NEGATIVE
Gonorrhea: NEGATIVE

## 2013-12-05 LAB — OB RESULTS CONSOLE ABO/RH: RH Type: POSITIVE

## 2013-12-05 LAB — OB RESULTS CONSOLE RPR: RPR: NONREACTIVE

## 2013-12-05 LAB — OB RESULTS CONSOLE HIV ANTIBODY (ROUTINE TESTING): HIV: NONREACTIVE

## 2013-12-05 LAB — OB RESULTS CONSOLE HEPATITIS B SURFACE ANTIGEN: Hepatitis B Surface Ag: NEGATIVE

## 2013-12-14 ENCOUNTER — Inpatient Hospital Stay (HOSPITAL_COMMUNITY)
Admission: AD | Admit: 2013-12-14 | Discharge: 2013-12-14 | Disposition: A | Discharge status: Home / Self Care | Payer: Medicaid Other | Source: Ambulatory Visit | Attending: Obstetrics and Gynecology | Admitting: Obstetrics and Gynecology

## 2013-12-14 ENCOUNTER — Encounter (HOSPITAL_COMMUNITY): Payer: Self-pay | Admitting: *Deleted

## 2013-12-14 DIAGNOSIS — M25552 Pain in left hip: Secondary | ICD-10-CM

## 2013-12-14 DIAGNOSIS — O99891 Other specified diseases and conditions complicating pregnancy: Secondary | ICD-10-CM | POA: Insufficient documentation

## 2013-12-14 DIAGNOSIS — O9989 Other specified diseases and conditions complicating pregnancy, childbirth and the puerperium: Principal | ICD-10-CM

## 2013-12-14 DIAGNOSIS — M25559 Pain in unspecified hip: Secondary | ICD-10-CM | POA: Insufficient documentation

## 2013-12-14 NOTE — MAU Provider Note (Signed)
  History     CSN: [id number]  Arrival date and time: 12/14/13 1440   None     Chief Complaint  Patient presents with  . Hip Pain   HPI This is a 28 y.o. at 652w0d who presents with c/o left hip pain. States it started yesterday and worsened today. Does not hurt with sitting but more with walking. Has not tried any Tylenol. Did try heat without relief.   RN Note' Pain in left hip started yesterday. Was able to walk. Today the pain is worse, hurts with any wt bearing and feels like it is going to give out       OB History   Grav Para Term Preterm Abortions TAB SAB Ect Mult Living   1               Past Medical History  Diagnosis Date  . Abnormal Pap smear 03/29/2013    ASC-US +HPV    History reviewed. No pertinent past surgical history.  Family History  Problem Relation Age of Onset  . Cancer Father     colon  . Cancer Paternal Aunt     colon  . Cancer Paternal Uncle     colon    History  Substance Use Topics  . Smoking status: Never Smoker   . Smokeless tobacco: Never Used  . Alcohol Use: No    Allergies: No Known Allergies  Prescriptions prior to admission  Medication Sig Dispense Refill  . docusate sodium (DOCQLACE) 100 MG capsule Take 100 mg by mouth 2 (two) times daily.        Marland Kitchen. ibuprofen (ADVIL,MOTRIN) 800 MG tablet Take 800 mg by mouth every 6 (six) hours as needed.        Marland Kitchen. levonorgestrel-ethinyl estradiol (NORDETTE) 0.15-30 MG-MCG tablet Take 1 tablet by mouth daily.      . traMADol (ULTRAM) 50 MG tablet Take 50 mg by mouth every 6 (six) hours as needed.          Review of Systems  Constitutional: Negative for fever and malaise/fatigue.  Gastrointestinal: Negative for abdominal pain.  Musculoskeletal: Positive for joint pain (Left hip).   Physical Exam   Blood pressure 107/54, pulse 86, temperature 98.4 F (36.9 C), temperature source Oral, resp. rate 18, last menstrual period 05/25/2013.  Physical Exam  Constitutional: She is oriented to  person, place, and time. She appears well-developed and well-nourished. No distress.  Cardiovascular: Normal rate.   Respiratory: Effort normal.  GI: Soft. There is no tenderness.  Genitourinary:  FHTs audible 160   Musculoskeletal: Normal range of motion. She exhibits tenderness (slightly tender over left trochanter). She exhibits no edema.  Straight leg raise negative on left. Raise of Right leg causes pain in left hip  Abduction of left leg does not reproduce pain.  There is a mild limp when she walks, but she can bear weight.   No pain over SI joint  Neurological: She is alert and oriented to person, place, and time.  Skin: Skin is warm and dry.  Psychiatric: She has a normal mood and affect.    MAU Course  Procedures   Assessment and Plan  A:  Left Hip pain, ? Bursitis vs sciatic pain  P:  Discussed with Dr Henderson CloudHorvath.       Recommends Tylenol, ice, and referral to Physical Therapy. Will have her call office to arrange that  [facility] Care Center Of Santa Monica IncWILLIAMS,Susan Slatten 12/14/2013, 3:14 PM

## 2013-12-14 NOTE — MAU Note (Signed)
Pain in left hip started yesterday.  Was able to walk.  Today the pain is worse, hurts with any wt bearing and feels like it is going to give out

## 2013-12-14 NOTE — Discharge Instructions (Signed)
Hip Bursitis  Bursitis is a swelling and soreness (inflammation) of a fluid-filled sac (bursa). This sac overlies and protects the joints.   CAUSES   · Injury.  · Overuse of the muscles surrounding the joint.  · Arthritis.  · Gout.  · Infection.  · Cold weather.  · Inadequate warm-up and conditioning prior to activities.  The cause may not be known.   SYMPTOMS   · Mild to severe irritation.  · Tenderness and swelling over the outside of the hip.  · Pain with motion of the hip.  · If the bursa becomes infected, a fever may be present. Redness, tenderness, and warmth will develop over the hip.  Symptoms usually lessen in 3 to 4 weeks with treatment, but can come back.  TREATMENT  If conservative treatment does not work, your caregiver may advise draining the bursa and injecting cortisone into the area. This may speed up the healing process. This may also be used as an initial treatment of choice.  HOME CARE INSTRUCTIONS   · Apply ice to the affected area for 15-20 minutes every 3 to 4 hours while awake for the first 2 days. Put the ice in a plastic bag and place a towel between the bag of ice and your skin.  · Rest the painful joint as much as possible, but continue to put the joint through a normal range of motion at least 4 times per day. When the pain lessens, begin normal, slow movements and usual activities to help prevent stiffness of the hip.  · Only take over-the-counter or prescription medicines for pain, discomfort, or fever as directed by your caregiver.  · Use crutches to limit weight bearing on the hip joint, if advised.  · Elevate your painful hip to reduce swelling. Use pillows for propping and cushioning your legs and hips.  · Gentle massage may provide comfort and decrease swelling.  SEEK IMMEDIATE MEDICAL CARE IF:   · Your pain increases even during treatment, or you are not improving.  · You have a fever.  · You have heat and inflammation over the involved bursa.  · You have any other questions or  concerns.  MAKE SURE YOU:   · Understand these instructions.  · Will watch your condition.  · Will get help right away if you are not doing well or get worse.  Document Released: 03/21/2002 Document Revised: 12/22/2011 Document Reviewed: 10/18/2008  ExitCare® Patient Information ©2014 [employer].

## 2014-05-25 LAB — OB RESULTS CONSOLE GBS: GBS: NEGATIVE

## 2014-05-30 ENCOUNTER — Inpatient Hospital Stay (HOSPITAL_COMMUNITY)
Admission: AD | Admit: 2014-05-30 | Discharge: 2014-05-30 | Disposition: A | Discharge status: Home / Self Care | Payer: Medicaid Other | Source: Ambulatory Visit | Attending: Obstetrics and Gynecology | Admitting: Obstetrics and Gynecology

## 2014-05-30 ENCOUNTER — Encounter (HOSPITAL_COMMUNITY): Payer: Self-pay | Admitting: *Deleted

## 2014-05-30 DIAGNOSIS — O479 False labor, unspecified: Secondary | ICD-10-CM | POA: Diagnosis present

## 2014-05-30 HISTORY — DX: Unspecified infectious disease: B99.9

## 2014-05-30 LAB — POCT FERN TEST: POCT Fern Test: NEGATIVE

## 2014-05-30 NOTE — MAU Note (Addendum)
Pad was damp this morning, no bleeding, no pain. No problems with preg

## 2014-05-30 NOTE — MAU Provider Note (Signed)
Called to do speculum exam.   Pt on monitor.  FHR with mod variability.  Toco with irregular ctx pattern.  Vaginal exam: no bleeding, moderate amt of thick white discharge, no pooling even with valsalva  I do not suspect Rupture of Membranes.    Nurse to call Dr. Claiborne Billingsallahan for further instructions.  Nada MaclachlanKaren Teague Clark, PA-C

## 2014-05-30 NOTE — MAU Note (Signed)
Patient states she wore a pad to bed last night at about 0300 had leaking of clear fluid that wet the pad. No leaking at this time. States some mild tightening but no pain. Reports good fetal movement.

## 2014-05-30 NOTE — Discharge Instructions (Signed)
Third Trimester of Pregnancy The third trimester is from week 29 through week 42, months 7 through 9. The third trimester is a time when the fetus is growing rapidly. At the end of the ninth month, the fetus is about 20 inches in length and weighs 6-10 pounds.  BODY CHANGES Your body goes through many changes during pregnancy. The changes vary from woman to woman.   Your weight will continue to increase. You can expect to gain 25-35 pounds (11-16 kg) by the end of the pregnancy.  You may begin to get stretch marks on your hips, abdomen, and breasts.  You may urinate more often because the fetus is moving lower into your pelvis and pressing on your bladder.  You may develop or continue to have heartburn as a result of your pregnancy.  You may develop constipation because certain hormones are causing the muscles that push waste through your intestines to slow down.  You may develop hemorrhoids or swollen, bulging veins (varicose veins).  You may have pelvic pain because of the weight gain and pregnancy hormones relaxing your joints between the bones in your pelvis. Backaches may result from overexertion of the muscles supporting your posture.  You may have changes in your hair. These can include thickening of your hair, rapid growth, and changes in texture. Some women also have hair loss during or after pregnancy, or hair that feels dry or thin. Your hair will most likely return to normal after your baby is born.  Your breasts will continue to grow and be tender. A yellow discharge may leak from your breasts called colostrum.  Your belly button may stick out.  You may feel short of breath because of your expanding uterus.  You may notice the fetus "dropping," or moving lower in your abdomen.  You may have a bloody mucus discharge. This usually occurs a few days to a week before labor begins.  Your cervix becomes thin and soft (effaced) near your due date. WHAT TO EXPECT AT YOUR PRENATAL  EXAMS  You will have prenatal exams every 2 weeks until week 36. Then, you will have weekly prenatal exams. During a routine prenatal visit:  You will be weighed to make sure you and the fetus are growing normally.  Your blood pressure is taken.  Your abdomen will be measured to track your baby's growth.  The fetal heartbeat will be listened to.  Any test results from the previous visit will be discussed.  You may have a cervical check near your due date to see if you have effaced. At around 36 weeks, your caregiver will check your cervix. At the same time, your caregiver will also perform a test on the secretions of the vaginal tissue. This test is to determine if a type of bacteria, Group B streptococcus, is present. Your caregiver will explain this further. Your caregiver may ask you:  What your birth plan is.  How you are feeling.  If you are feeling the baby move.  If you have had any abnormal symptoms, such as leaking fluid, bleeding, severe headaches, or abdominal cramping.  If you have any questions. Other tests or screenings that may be performed during your third trimester include:  Blood tests that check for low iron levels (anemia).  Fetal testing to check the health, activity level, and growth of the fetus. Testing is done if you have certain medical conditions or if there are problems during the pregnancy. FALSE LABOR You may feel small, irregular contractions that   eventually go away. These are called Braxton Hicks contractions, or false labor. Contractions may last for hours, days, or even weeks before true labor sets in. If contractions come at regular intervals, intensify, or become painful, it is best to be seen by your caregiver.  SIGNS OF LABOR   Menstrual-like cramps.  Contractions that are 5 minutes apart or less.  Contractions that start on the top of the uterus and spread down to the lower abdomen and back.  A sense of increased pelvic pressure or back  pain.  A watery or bloody mucus discharge that comes from the vagina. If you have any of these signs before the 37th week of pregnancy, call your caregiver right away. You need to go to the hospital to get checked immediately. HOME CARE INSTRUCTIONS   Avoid all smoking, herbs, alcohol, and unprescribed drugs. These chemicals affect the formation and growth of the baby.  Follow your caregiver's instructions regarding medicine use. There are medicines that are either safe or unsafe to take during pregnancy.  Exercise only as directed by your caregiver. Experiencing uterine cramps is a good sign to stop exercising.  Continue to eat regular, healthy meals.  Wear a good support bra for breast tenderness.  Do not use hot tubs, steam rooms, or saunas.  Wear your seat belt at all times when driving.  Avoid raw meat, uncooked cheese, cat litter boxes, and soil used by cats. These carry germs that can cause birth defects in the baby.  Take your prenatal vitamins.  Try taking a stool softener (if your caregiver approves) if you develop constipation. Eat more high-fiber foods, such as fresh vegetables or fruit and whole grains. Drink plenty of fluids to keep your urine clear or pale yellow.  Take warm sitz baths to soothe any pain or discomfort caused by hemorrhoids. Use hemorrhoid cream if your caregiver approves.  If you develop varicose veins, wear support hose. Elevate your feet for 15 minutes, 3-4 times a day. Limit salt in your diet.  Avoid heavy lifting, wear low heal shoes, and practice good posture.  Rest a lot with your legs elevated if you have leg cramps or low back pain.  Visit your dentist if you have not gone during your pregnancy. Use a soft toothbrush to brush your teeth and be gentle when you floss.  A sexual relationship may be continued unless your caregiver directs you otherwise.  Do not travel far distances unless it is absolutely necessary and only with the approval  of your caregiver.  Take prenatal classes to understand, practice, and ask questions about the labor and delivery.  Make a trial run to the hospital.  Pack your hospital bag.  Prepare the baby's nursery.  Continue to go to all your prenatal visits as directed by your caregiver. SEEK MEDICAL CARE IF:  You are unsure if you are in labor or if your water has broken.  You have dizziness.  You have mild pelvic cramps, pelvic pressure, or nagging pain in your abdominal area.  You have persistent nausea, vomiting, or diarrhea.  You have a bad smelling vaginal discharge.  You have pain with urination. SEEK IMMEDIATE MEDICAL CARE IF:   You have a fever.  You are leaking fluid from your vagina.  You have spotting or bleeding from your vagina.  You have severe abdominal cramping or pain.  You have rapid weight loss or gain.  You have shortness of breath with chest pain.  You notice sudden or extreme swelling   of your face, hands, ankles, feet, or legs.  You have not felt your baby move in over an hour.  You have severe headaches that do not go away with medicine.  You have vision changes. Document Released: 09/23/2001 Document Revised: 10/04/2013 Document Reviewed: 11/30/2012 [employer] Patient Information 2015 [employer]. This information is not intended to replace advice given to you by your health care provider. Make sure you discuss any questions you have with your health care provider.  

## 2014-05-30 NOTE — MAU Note (Signed)
Unable to complete e-sign for d/c.  Pt signed, then mouse froze, unable to access elsewhere as "still open"

## 2014-06-23 ENCOUNTER — Encounter (HOSPITAL_COMMUNITY): Payer: Self-pay | Admitting: *Deleted

## 2014-06-23 ENCOUNTER — Telehealth (HOSPITAL_COMMUNITY): Payer: Self-pay | Admitting: *Deleted

## 2014-06-23 NOTE — Telephone Encounter (Signed)
Preadmission screen  

## 2014-06-25 ENCOUNTER — Inpatient Hospital Stay (HOSPITAL_COMMUNITY)
Admission: AD | Admit: 2014-06-25 | Discharge: 2014-06-25 | Disposition: A | Discharge status: Home / Self Care | Payer: Medicaid Other | Source: Ambulatory Visit | Attending: Obstetrics and Gynecology | Admitting: Obstetrics and Gynecology

## 2014-06-25 ENCOUNTER — Encounter (HOSPITAL_COMMUNITY): Payer: Self-pay | Admitting: *Deleted

## 2014-06-25 DIAGNOSIS — O324XX0 Maternal care for high head at term, not applicable or unspecified: Secondary | ICD-10-CM | POA: Diagnosis present

## 2014-06-25 DIAGNOSIS — O411090 Infection of amniotic sac and membranes, unspecified, unspecified trimester, not applicable or unspecified: Secondary | ICD-10-CM | POA: Diagnosis present

## 2014-06-25 DIAGNOSIS — Z87891 Personal history of nicotine dependence: Secondary | ICD-10-CM

## 2014-06-25 LAB — WET PREP, GENITAL
Clue Cells Wet Prep HPF POC: NONE SEEN
Trich, Wet Prep: NONE SEEN
Yeast Wet Prep HPF POC: NONE SEEN

## 2014-06-25 LAB — POCT FERN TEST: POCT Fern Test: NEGATIVE

## 2014-06-25 NOTE — MAU Provider Note (Signed)
S: Susan Solomon is a 28 y.o. G1P0 at [redacted]w[redacted]d who presents today with leaking of fluid. She states that she has been leaking all day. She denies any pain or contractions. She has had some spotting today as well. She denies any recent intercourse. She states that since being on the monitor the fetus has been active, but the fetus was not as active earlier today.  O: VSS, afebrile Abdomen: soft, non tender External: no lesion Vagina: large amount of tan/pink discharge. No pooling Cervix: pink, smooth, no fluid seen with valsalva Uterus: AGA FHT: 120, moderate with 15x15 accles, no decels Toco: no UCs  A/P Exam for ROM Fern pending Wet prep pending  RN will report to attending MD

## 2014-06-25 NOTE — MAU Note (Signed)
Pt c/o possible LOF around 1000 spiradically throughout the day. Minimal bright red spotting as well and DFM x 3 days. Ctx are irregular, without pain.

## 2014-06-25 NOTE — Discharge Instructions (Signed)
Induction scheduled for Tuesday, Sept. 15, 2015 at 7:30 pm.

## 2014-06-26 ENCOUNTER — Inpatient Hospital Stay (HOSPITAL_COMMUNITY)
Admission: AD | Admit: 2014-06-26 | Discharge: 2014-06-29 | DRG: 765 | Disposition: A | Discharge status: Home / Self Care | Payer: Medicaid Other | Source: Ambulatory Visit | Attending: Obstetrics | Admitting: Obstetrics

## 2014-06-26 ENCOUNTER — Inpatient Hospital Stay (HOSPITAL_COMMUNITY): Payer: Medicaid Other | Admitting: Anesthesiology

## 2014-06-26 ENCOUNTER — Encounter (HOSPITAL_COMMUNITY): Payer: Medicaid Other | Admitting: Anesthesiology

## 2014-06-26 ENCOUNTER — Encounter (HOSPITAL_COMMUNITY): Payer: Self-pay | Admitting: *Deleted

## 2014-06-26 ENCOUNTER — Encounter (HOSPITAL_COMMUNITY)
Admission: AD | Disposition: A | Discharge status: Home / Self Care | Payer: Self-pay | Source: Ambulatory Visit | Attending: Obstetrics

## 2014-06-26 DIAGNOSIS — O411090 Infection of amniotic sac and membranes, unspecified, unspecified trimester, not applicable or unspecified: Secondary | ICD-10-CM | POA: Diagnosis present

## 2014-06-26 DIAGNOSIS — O324XX0 Maternal care for high head at term, not applicable or unspecified: Secondary | ICD-10-CM | POA: Diagnosis present

## 2014-06-26 DIAGNOSIS — O479 False labor, unspecified: Secondary | ICD-10-CM | POA: Diagnosis present

## 2014-06-26 DIAGNOSIS — Z87891 Personal history of nicotine dependence: Secondary | ICD-10-CM | POA: Diagnosis not present

## 2014-06-26 HISTORY — DX: Unspecified abnormal cytological findings in specimens from vagina: R87.629

## 2014-06-26 LAB — CBC
HCT: 37.3 % (ref 36.0–46.0)
Hemoglobin: 12 g/dL (ref 12.0–15.0)
MCH: 24.7 pg — ABNORMAL LOW (ref 26.0–34.0)
MCHC: 32.2 g/dL (ref 30.0–36.0)
MCV: 76.7 fL — ABNORMAL LOW (ref 78.0–100.0)
Platelets: 261 10*3/uL (ref 150–400)
RBC: 4.86 MIL/uL (ref 3.87–5.11)
RDW: 20.1 % — ABNORMAL HIGH (ref 11.5–15.5)
WBC: 10.2 10*3/uL (ref 4.0–10.5)

## 2014-06-26 LAB — RPR

## 2014-06-26 LAB — TYPE AND SCREEN
ABO/RH(D): O POS
Antibody Screen: NEGATIVE

## 2014-06-26 SURGERY — Surgical Case
Anesthesia: Epidural | Site: Abdomen

## 2014-06-26 MED ORDER — OXYTOCIN 10 UNIT/ML IJ SOLN
INTRAMUSCULAR | Status: AC
  Filled 2014-06-26: qty 4

## 2014-06-26 MED ORDER — BUTORPHANOL TARTRATE 1 MG/ML IJ SOLN: 1.0000 mg | INTRAMUSCULAR | Status: DC | PRN

## 2014-06-26 MED ORDER — GENTAMICIN SULFATE 40 MG/ML IJ SOLN
1.5000 mg/kg | Freq: Once | INTRAVENOUS | Status: AC
  Administered 2014-06-26: 130 mg via INTRAVENOUS
  Filled 2014-06-26: qty 3.25

## 2014-06-26 MED ORDER — OXYCODONE-ACETAMINOPHEN 5-325 MG PO TABS: 2.0000 | ORAL_TABLET | ORAL | Status: DC | PRN

## 2014-06-26 MED ORDER — CITRIC ACID-SODIUM CITRATE 334-500 MG/5ML PO SOLN
30.0000 mL | ORAL | Status: DC | PRN
  Administered 2014-06-26 (×2): 30 mL via ORAL
  Filled 2014-06-26 (×2): qty 15

## 2014-06-26 MED ORDER — SODIUM BICARBONATE 8.4 % IV SOLN
INTRAVENOUS | Status: DC | PRN
  Administered 2014-06-26 (×2): 5 mL via EPIDURAL
  Administered 2014-06-26: 10 mL via EPIDURAL

## 2014-06-26 MED ORDER — KETOROLAC TROMETHAMINE 30 MG/ML IJ SOLN
INTRAMUSCULAR | Status: AC
  Filled 2014-06-26: qty 1

## 2014-06-26 MED ORDER — MEPERIDINE HCL 25 MG/ML IJ SOLN
INTRAMUSCULAR | Status: DC | PRN
  Administered 2014-06-26 (×2): 12.5 mg via INTRAVENOUS

## 2014-06-26 MED ORDER — FENTANYL CITRATE 0.05 MG/ML IJ SOLN
INTRAMUSCULAR | Status: DC | PRN
  Administered 2014-06-26: 100 ug via EPIDURAL

## 2014-06-26 MED ORDER — OXYTOCIN 40 UNITS IN LACTATED RINGERS INFUSION - SIMPLE MED
1.0000 m[IU]/min | INTRAVENOUS | Status: DC
  Administered 2014-06-26: 2 m[IU]/min via INTRAVENOUS

## 2014-06-26 MED ORDER — TERBUTALINE SULFATE 1 MG/ML IJ SOLN
0.2500 mg | Freq: Once | INTRAMUSCULAR | Status: DC | PRN
Start: 2014-06-26 — End: 2014-06-26

## 2014-06-26 MED ORDER — EPHEDRINE 5 MG/ML INJ: 10.0000 mg | INTRAVENOUS | Status: DC | PRN

## 2014-06-26 MED ORDER — OXYTOCIN 10 UNIT/ML IJ SOLN
40.0000 [IU] | INTRAVENOUS | Status: DC | PRN
  Administered 2014-06-26: 40 [IU] via INTRAVENOUS

## 2014-06-26 MED ORDER — SCOPOLAMINE 1 MG/3DAYS TD PT72
MEDICATED_PATCH | TRANSDERMAL | Status: AC
  Filled 2014-06-26: qty 1

## 2014-06-26 MED ORDER — FLEET ENEMA 7-19 GM/118ML RE ENEM: 1.0000 | ENEMA | RECTAL | Status: DC | PRN

## 2014-06-26 MED ORDER — ONDANSETRON HCL 4 MG/2ML IJ SOLN
INTRAMUSCULAR | Status: DC | PRN
  Administered 2014-06-26: 4 mg via INTRAVENOUS

## 2014-06-26 MED ORDER — LIDOCAINE HCL (PF) 1 % IJ SOLN
30.0000 mL | INTRAMUSCULAR | Status: AC | PRN
  Administered 2014-06-26 (×2): 5 mL via SUBCUTANEOUS

## 2014-06-26 MED ORDER — KETOROLAC TROMETHAMINE 30 MG/ML IJ SOLN: 30.0000 mg | Freq: Four times a day (QID) | INTRAMUSCULAR | Status: AC | PRN

## 2014-06-26 MED ORDER — CEFAZOLIN SODIUM-DEXTROSE 2-3 GM-% IV SOLR
INTRAVENOUS | Status: DC | PRN
  Administered 2014-06-26: 2 g via INTRAVENOUS

## 2014-06-26 MED ORDER — LACTATED RINGERS IV SOLN
INTRAVENOUS | Status: DC
  Administered 2014-06-26 (×2): via INTRAVENOUS

## 2014-06-26 MED ORDER — MEPERIDINE HCL 25 MG/ML IJ SOLN: 6.2500 mg | INTRAMUSCULAR | Status: DC | PRN

## 2014-06-26 MED ORDER — SCOPOLAMINE 1 MG/3DAYS TD PT72
MEDICATED_PATCH | TRANSDERMAL | Status: DC | PRN
  Administered 2014-06-26: 1 via TRANSDERMAL

## 2014-06-26 MED ORDER — MORPHINE SULFATE (PF) 0.5 MG/ML IJ SOLN
INTRAMUSCULAR | Status: DC | PRN
  Administered 2014-06-26: 1 mg via INTRAVENOUS

## 2014-06-26 MED ORDER — CEFAZOLIN SODIUM-DEXTROSE 2-3 GM-% IV SOLR
2.0000 g | Freq: Once | INTRAVENOUS | Status: DC
  Filled 2014-06-26: qty 50

## 2014-06-26 MED ORDER — MORPHINE SULFATE (PF) 0.5 MG/ML IJ SOLN
INTRAMUSCULAR | Status: DC | PRN
  Administered 2014-06-26: 4 mg via EPIDURAL

## 2014-06-26 MED ORDER — LIDOCAINE HCL (PF) 1 % IJ SOLN
INTRAMUSCULAR | Status: AC
  Filled 2014-06-26: qty 30

## 2014-06-26 MED ORDER — FENTANYL 2.5 MCG/ML BUPIVACAINE 1/10 % EPIDURAL INFUSION (WH - ANES)
14.0000 mL/h | INTRAMUSCULAR | Status: DC | PRN
  Administered 2014-06-26 (×3): 14 mL/h via EPIDURAL
  Filled 2014-06-26 (×3): qty 125

## 2014-06-26 MED ORDER — LIDOCAINE-EPINEPHRINE (PF) 2 %-1:200000 IJ SOLN
INTRAMUSCULAR | Status: AC
  Filled 2014-06-26: qty 20

## 2014-06-26 MED ORDER — 0.9 % SODIUM CHLORIDE (POUR BTL) OPTIME
TOPICAL | Status: DC | PRN
  Administered 2014-06-26: 1000 mL

## 2014-06-26 MED ORDER — PHENYLEPHRINE 40 MCG/ML (10ML) SYRINGE FOR IV PUSH (FOR BLOOD PRESSURE SUPPORT)
80.0000 ug | PREFILLED_SYRINGE | INTRAVENOUS | Status: DC | PRN
  Filled 2014-06-26: qty 10

## 2014-06-26 MED ORDER — ONDANSETRON HCL 4 MG/2ML IJ SOLN: 4.0000 mg | Freq: Four times a day (QID) | INTRAMUSCULAR | Status: DC | PRN

## 2014-06-26 MED ORDER — METHYLERGONOVINE MALEATE 0.2 MG/ML IJ SOLN
INTRAMUSCULAR | Status: AC
  Filled 2014-06-26: qty 1

## 2014-06-26 MED ORDER — LACTATED RINGERS IV SOLN
500.0000 mL | INTRAVENOUS | Status: DC | PRN
  Administered 2014-06-26: 1000 mL via INTRAVENOUS

## 2014-06-26 MED ORDER — ACETAMINOPHEN 325 MG PO TABS: 650.0000 mg | ORAL_TABLET | ORAL | Status: DC | PRN

## 2014-06-26 MED ORDER — SODIUM CHLORIDE 0.9 % IV SOLN
2.0000 g | Freq: Four times a day (QID) | INTRAVENOUS | Status: DC
  Administered 2014-06-26: 2 g via INTRAVENOUS
  Filled 2014-06-26 (×2): qty 2000

## 2014-06-26 MED ORDER — ACETAMINOPHEN 650 MG RE SUPP
650.0000 mg | Freq: Once | RECTAL | Status: AC
  Administered 2014-06-26: 650 mg via RECTAL
  Filled 2014-06-26: qty 1

## 2014-06-26 MED ORDER — KETOROLAC TROMETHAMINE 30 MG/ML IJ SOLN
30.0000 mg | Freq: Four times a day (QID) | INTRAMUSCULAR | Status: AC | PRN
  Administered 2014-06-26: 30 mg via INTRAVENOUS

## 2014-06-26 MED ORDER — LACTATED RINGERS IV SOLN
500.0000 mL | Freq: Once | INTRAVENOUS | Status: AC
  Administered 2014-06-26: 500 mL via INTRAVENOUS

## 2014-06-26 MED ORDER — PHENYLEPHRINE 40 MCG/ML (10ML) SYRINGE FOR IV PUSH (FOR BLOOD PRESSURE SUPPORT): 80.0000 ug | PREFILLED_SYRINGE | INTRAVENOUS | Status: DC | PRN

## 2014-06-26 MED ORDER — OXYTOCIN 40 UNITS IN LACTATED RINGERS INFUSION - SIMPLE MED
62.5000 mL/h | INTRAVENOUS | Status: DC
  Filled 2014-06-26: qty 1000

## 2014-06-26 MED ORDER — MORPHINE SULFATE 0.5 MG/ML IJ SOLN
INTRAMUSCULAR | Status: AC
  Filled 2014-06-26: qty 10

## 2014-06-26 MED ORDER — LACTATED RINGERS IV SOLN
INTRAVENOUS | Status: DC | PRN
  Administered 2014-06-26: 21:00:00 via INTRAVENOUS

## 2014-06-26 MED ORDER — OXYCODONE-ACETAMINOPHEN 5-325 MG PO TABS: 1.0000 | ORAL_TABLET | ORAL | Status: DC | PRN

## 2014-06-26 MED ORDER — METHYLERGONOVINE MALEATE 0.2 MG/ML IJ SOLN
INTRAMUSCULAR | Status: DC | PRN
  Administered 2014-06-26: 0.2 mg via INTRAMUSCULAR

## 2014-06-26 MED ORDER — SODIUM BICARBONATE 8.4 % IV SOLN
INTRAVENOUS | Status: AC
  Filled 2014-06-26: qty 50

## 2014-06-26 MED ORDER — MEPERIDINE HCL 25 MG/ML IJ SOLN
INTRAMUSCULAR | Status: AC
  Filled 2014-06-26: qty 1

## 2014-06-26 MED ORDER — FENTANYL CITRATE 0.05 MG/ML IJ SOLN: 25.0000 ug | INTRAMUSCULAR | Status: DC | PRN

## 2014-06-26 MED ORDER — DIPHENHYDRAMINE HCL 50 MG/ML IJ SOLN: 12.5000 mg | INTRAMUSCULAR | Status: DC | PRN

## 2014-06-26 MED ORDER — ACETAMINOPHEN 10 MG/ML IV SOLN: 1000.0000 mg | Freq: Once | INTRAVENOUS | Status: DC

## 2014-06-26 MED ORDER — ONDANSETRON HCL 4 MG/2ML IJ SOLN
INTRAMUSCULAR | Status: AC
  Filled 2014-06-26: qty 2

## 2014-06-26 MED ORDER — FENTANYL CITRATE 0.05 MG/ML IJ SOLN
INTRAMUSCULAR | Status: AC
  Filled 2014-06-26: qty 2

## 2014-06-26 MED ORDER — CEFAZOLIN SODIUM-DEXTROSE 2-3 GM-% IV SOLR
INTRAVENOUS | Status: AC
  Filled 2014-06-26: qty 50

## 2014-06-26 MED ORDER — OXYTOCIN BOLUS FROM INFUSION: 500.0000 mL | INTRAVENOUS | Status: DC

## 2014-06-26 SURGICAL SUPPLY — 37 items
BENZOIN TINCTURE PRP APPL 2/3 (GAUZE/BANDAGES/DRESSINGS) ×2 IMPLANT
BLADE SURG 10 STRL SS (BLADE) ×4 IMPLANT
CLAMP CORD UMBIL (MISCELLANEOUS) IMPLANT
CLOTH BEACON ORANGE TIMEOUT ST (SAFETY) ×2 IMPLANT
DERMABOND ADVANCED (GAUZE/BANDAGES/DRESSINGS)
DERMABOND ADVANCED .7 DNX12 (GAUZE/BANDAGES/DRESSINGS) IMPLANT
DRAPE LG THREE QUARTER DISP (DRAPES) ×2 IMPLANT
DRSG OPSITE POSTOP 4X10 (GAUZE/BANDAGES/DRESSINGS) ×2 IMPLANT
DURAPREP 26ML APPLICATOR (WOUND CARE) ×2 IMPLANT
ELECT REM PT RETURN 9FT ADLT (ELECTROSURGICAL) ×2
ELECTRODE REM PT RTRN 9FT ADLT (ELECTROSURGICAL) ×1 IMPLANT
EXTRACTOR VACUUM M CUP 4 TUBE (SUCTIONS) IMPLANT
GLOVE BIO SURGEON STRL SZ 6 (GLOVE) ×2 IMPLANT
GLOVE INDICATOR 6.0 STRL GRN (GLOVE) ×2 IMPLANT
GOWN STRL REUS W/TWL LRG LVL3 (GOWN DISPOSABLE) ×4 IMPLANT
HEMOSTAT SURGICEL 4X8 (HEMOSTASIS) ×2 IMPLANT
KIT ABG SYR 3ML LUER SLIP (SYRINGE) ×2 IMPLANT
NEEDLE HYPO 25X5/8 SAFETYGLIDE (NEEDLE) ×2 IMPLANT
NS IRRIG 1000ML POUR BTL (IV SOLUTION) ×2 IMPLANT
PACK C SECTION WH (CUSTOM PROCEDURE TRAY) ×2 IMPLANT
PAD OB MATERNITY 4.3X12.25 (PERSONAL CARE ITEMS) ×2 IMPLANT
RTRCTR C-SECT PINK 25CM LRG (MISCELLANEOUS) IMPLANT
SPONGE LAP 18X18 X RAY DECT (DISPOSABLE) ×4 IMPLANT
STRIP CLOSURE SKIN 1/2X4 (GAUZE/BANDAGES/DRESSINGS) ×2 IMPLANT
SUT MNCRL 0 VIOLET CTX 36 (SUTURE) ×5 IMPLANT
SUT MNCRL AB 3-0 PS2 27 (SUTURE) ×2 IMPLANT
SUT MONOCRYL 0 CTX 36 (SUTURE) ×5
SUT PLAIN 0 NONE (SUTURE) IMPLANT
SUT PLAIN 2 0 (SUTURE) ×1
SUT PLAIN ABS 2-0 CT1 27XMFL (SUTURE) ×1 IMPLANT
SUT VIC AB 0 CT1 27 (SUTURE) ×2
SUT VIC AB 0 CT1 27XBRD ANBCTR (SUTURE) ×2 IMPLANT
SUT VIC AB 2-0 CT1 27 (SUTURE)
SUT VIC AB 2-0 CT1 TAPERPNT 27 (SUTURE) IMPLANT
TOWEL OR 17X24 6PK STRL BLUE (TOWEL DISPOSABLE) ×2 IMPLANT
TRAY FOLEY CATH 14FR (SET/KITS/TRAYS/PACK) IMPLANT
WATER STERILE IRR 1000ML POUR (IV SOLUTION) IMPLANT

## 2014-06-26 NOTE — Progress Notes (Signed)
Pt seen and examined.    BP 114/64  Pulse 68  Temp(Src) 100.1 F (37.8 C) (Oral)  Resp 20  Ht  (1.676 m)  Wt 88.451 kg (195 lb)  BMI 31.49 kg/m2  SpO2 100%  LMP 05/25/2013 EFM: 140s, mod var Cat 1  She was 10/10/+1 at 1430.  She labored down for two hours with minimal change in station. She has pushed well for 2 hrs.  On SVE, baby w significant caput and unchanged fetal station. She is exhausted from good maternal effort with pushing.  Recommend to OR for arrest of descent.  Patient agrees.   She is consented for 1 LTCS.  Risks discussed, including but not limited to bleeding, infection, damage to surrounding structures (bowel, bladder, tubes, ovaries), risks of additional procedures Fetal status remains reassuring.

## 2014-06-26 NOTE — H&P (Signed)
28 y.o. [redacted]w[redacted]d  G1P0 presents with painful contractions.  She was seen earlier in MAU and was 1.5cm. On return, sve was 6/80/-1.  Otherwise has good fetal movement and no bleeding.  Past Medical History  Diagnosis Date  . Abnormal Pap smear 03/29/2013    ASC-US +HPV  . Infection     UTI  . Vaginal Pap smear, abnormal     Past Surgical History  Procedure Laterality Date  . No past surgeries      OB History  Gravida Para Term Preterm AB SAB TAB Ectopic Multiple Living  1             # Outcome Date GA Lbr Len/2nd Weight Sex Delivery Anes PTL Lv  1 CUR               History   Social History  . Marital Status: Single    Spouse Name: N/A    Number of Children: N/A  . Years of Education: N/A   Occupational History  . Not on file.   Social History Main Topics  . Smoking status: Former Games developer  . Smokeless tobacco: Never Used     Comment: prior to preg  . Alcohol Use: No  . Drug Use: No  . Sexual Activity: Yes    Birth Control/ Protection: Pill, None   Other Topics Concern  . Not on file   Social History Narrative  . No narrative on file   Review of patient's allergies indicates no known allergies.    Prenatal Transfer Tool  Maternal Diabetes: No Genetic Screening: Normal Maternal Ultrasounds/Referrals: Normal Fetal Ultrasounds or other Referrals:  None Maternal Substance Abuse:  No Significant Maternal Medications:  None Significant Maternal Lab Results: None  Other PNC: uncomplicated.    Filed Vitals:   06/26/14 0801  BP: 106/59  Pulse: 70  Temp: 97.6 F (36.4 C)  Resp: 18     General:  NAD Lungs: CTAB Cardiac: RRR Abdomen:  soft, gravid, EFW 8# Ex:  tr edema SVE:  8/100/0, AROM clear fluid FHTs:  120, mod var, Cat 1 Toco:  q2-3 min   A/P   28 y.o. [redacted]w[redacted]d  G1P0 presents with labor.   Progressing well.  Comfortable w epidural  FSR/ vtx/ GBS neg/pelvis adequate.  Anticipate SVD  Marlow Baars

## 2014-06-26 NOTE — Anesthesia Postprocedure Evaluation (Signed)
  Anesthesia Post-op Note  Patient: Susan Solomon  Procedure(s) Performed: Procedure(s): CESAREAN SECTION (N/A)  Patient Location: PACU  Anesthesia Type:Epidural  Level of Consciousness: awake and alert   Airway and Oxygen Therapy: Patient Spontanous Breathing  Post-op Pain: none  Post-op Assessment: Post-op Vital signs reviewed, Patient's Cardiovascular Status Stable, Respiratory Function Stable, Patent Airway, No signs of Nausea or vomiting and Pain level controlled  Post-op Vital Signs: Reviewed and stable  Last Vitals:  Filed Vitals:   06/26/14 2330  BP:   Pulse: 77  Temp: 37.1 C  Resp: 16    Complications: No apparent anesthesia complications

## 2014-06-26 NOTE — Progress Notes (Signed)
Dr. Chestine Spore notified of pt's progress, no urge to push, epidural decreased 74ml/hr per CRNABoneta Lucks) per dr. Ophelia Charter orders.

## 2014-06-26 NOTE — Anesthesia Procedure Notes (Signed)
Epidural Patient location during procedure: OB Start time: 06/26/2014 6:56 AM End time: 06/26/2014 7:11 AM  Staffing Anesthesiologist: Cheney Gosch, CHRIS Performed by: anesthesiologist   Preanesthetic Checklist Completed: patient identified, surgical consent, pre-op evaluation, timeout performed, IV checked, risks and benefits discussed and monitors and equipment checked  Epidural Patient position: sitting Prep: site prepped and draped and DuraPrep Patient monitoring: heart rate, cardiac monitor, continuous pulse ox and blood pressure Approach: midline Location: L4-L5 Injection technique: LOR saline  Needle:  Needle type: Tuohy  Needle gauge: 17 G Needle length: 9 cm Needle insertion depth: 7 cm Catheter type: closed end flexible Catheter size: 19 Gauge Catheter at skin depth: 13 cm Test dose: Other  Assessment Events: blood not aspirated, injection not painful, no injection resistance, negative IV test and no paresthesia  Additional Notes H+P and labs checked, risks and benefits discussed with the patient, consent obtained, procedure tolerated well and without complications.  Reason for block:procedure for pain

## 2014-06-26 NOTE — Progress Notes (Signed)
Risks and benefits of c-section discussed with pt, pt verbalized understanding and gave consent.

## 2014-06-26 NOTE — Brief Op Note (Signed)
06/26/2014  10:23 PM  PATIENT:  Susan Solomon  28 y.o. female  PRE-OPERATIVE DIAGNOSIS:  Arrest of descent, [place]  POST-OPERATIVE DIAGNOSIS:  Arrest of descent, Ch[place]  PROCEDURE:  Procedure(s): CESAREAN SECTION (N/A)  SURGEON:  Surgeon(s) and Role:    * Marlow Baars, MD - Primary    * Leslie Andrea, MD - Assisting  PHYSICIAN ASSISTANT:   ANESTHESIA:   epidural  EBL:  Total I/O In: 2000 [I.V.:2000] Out: 2050 [Urine:1250; Blood:800]  BLOOD ADMINISTERED:none  DRAINS: Urinary Catheter (Foley)   LOCAL MEDICATIONS USED:  NONE  SPECIMEN:  No Specimen  DISPOSITION OF SPECIMEN:  N/A  COUNTS:  YES  TOURNIQUET:  * No tourniquets in log *  DICTATION: .Note written in EPIC  PLAN OF CARE: Admit to inpatient   PATIENT DISPOSITION:  PACU - hemodynamically stable.   Delay start of Pharmacological VTE agent (>24hrs) due to surgical blood loss or risk of bleeding: not applicable

## 2014-06-26 NOTE — MAU Note (Signed)
Pt seen earlier in MAU for labor check and has returned with increased contractions.

## 2014-06-26 NOTE — Progress Notes (Signed)
While awaiting for OR availability, was notified of a maternal temp of 19 F w maternal tachycardia of 107 bpm.   Will diagnose with chorio. IV tylenol now, amp/gent. Will plan for 24h post op abx.

## 2014-06-26 NOTE — Anesthesia Preprocedure Evaluation (Signed)
Anesthesia Evaluation  Patient identified by MRN, date of birth, ID band  Reviewed: Allergy & Precautions, H&P , NPO status , Patient's Chart, lab work & pertinent test results  History of Anesthesia Complications Negative for: history of anesthetic complications  Airway Mallampati: II TM Distance: >3 FB Neck ROM: Full    Dental  (+) Teeth Intact   Pulmonary neg pulmonary ROS, former smoker,  breath sounds clear to auscultation        Cardiovascular negative cardio ROS  Rhythm:Regular     Neuro/Psych negative neurological ROS     GI/Hepatic negative GI ROS, Neg liver ROS,   Endo/Other  negative endocrine ROS  Renal/GU negative Renal ROS     Musculoskeletal   Abdominal   Peds  Hematology negative hematology ROS (+)   Anesthesia Other Findings   Reproductive/Obstetrics (+) Pregnancy                           Anesthesia Physical Anesthesia Plan  ASA: II  Anesthesia Plan: Epidural   Post-op Pain Management:    Induction:   Airway Management Planned:   Additional Equipment:   Intra-op Plan:   Post-operative Plan:   Informed Consent: I have reviewed the patients History and Physical, chart, labs and discussed the procedure including the risks, benefits and alternatives for the proposed anesthesia with the patient or authorized representative who has indicated his/her understanding and acceptance.     Plan Discussed with: Anesthesiologist  Anesthesia Plan Comments:         Anesthesia Quick Evaluation

## 2014-06-26 NOTE — Progress Notes (Deleted)
Dr. Chestine Spore notified of pt's progress, no urge to push, epidural decreased 36ml/hr per dr. Ophelia Charter orders.

## 2014-06-26 NOTE — MAU Note (Signed)
Pt states that contractions became more intense around 2200 last night. Pt states positive fetal movement. Denies SROM/gush of fluid. States positive bright red blood when she wipes. Positive Nausea, no v/d. Denies STI's/MRSA.

## 2014-06-26 NOTE — Transfer of Care (Signed)
Immediate Anesthesia Transfer of Care Note  Patient: Susan Solomon  Procedure(s) Performed: Procedure(s): CESAREAN SECTION (N/A)  Patient Location: PACU  Anesthesia Type:Epidural  Level of Consciousness: awake  Airway & Oxygen Therapy: Patient Spontanous Breathing  Post-op Assessment: Report given to PACU RN and Post -op Vital signs reviewed and stable  Post vital signs: stable  Complications: No apparent anesthesia complications

## 2014-06-27 ENCOUNTER — Inpatient Hospital Stay (HOSPITAL_COMMUNITY): Admit: 2014-06-27 | Payer: Medicaid Other

## 2014-06-27 ENCOUNTER — Encounter (HOSPITAL_COMMUNITY): Payer: Self-pay | Admitting: Obstetrics

## 2014-06-27 LAB — CBC
HCT: 34.8 % — ABNORMAL LOW (ref 36.0–46.0)
Hemoglobin: 11.3 g/dL — ABNORMAL LOW (ref 12.0–15.0)
MCH: 25.2 pg — ABNORMAL LOW (ref 26.0–34.0)
MCHC: 32.5 g/dL (ref 30.0–36.0)
MCV: 77.7 fL — ABNORMAL LOW (ref 78.0–100.0)
Platelets: 228 10*3/uL (ref 150–400)
RBC: 4.48 MIL/uL (ref 3.87–5.11)
RDW: 20.4 % — ABNORMAL HIGH (ref 11.5–15.5)
WBC: 18.4 10*3/uL — ABNORMAL HIGH (ref 4.0–10.5)

## 2014-06-27 LAB — ABO/RH: ABO/RH(D): O POS

## 2014-06-27 MED ORDER — ONDANSETRON HCL 4 MG/2ML IJ SOLN: 4.0000 mg | Freq: Three times a day (TID) | INTRAMUSCULAR | Status: DC | PRN

## 2014-06-27 MED ORDER — NALOXONE HCL 0.4 MG/ML IJ SOLN: 0.4000 mg | INTRAMUSCULAR | Status: DC | PRN

## 2014-06-27 MED ORDER — OXYCODONE-ACETAMINOPHEN 5-325 MG PO TABS: 2.0000 | ORAL_TABLET | ORAL | Status: DC | PRN

## 2014-06-27 MED ORDER — DIBUCAINE 1 % RE OINT: 1.0000 "application " | TOPICAL_OINTMENT | RECTAL | Status: DC | PRN

## 2014-06-27 MED ORDER — SIMETHICONE 80 MG PO CHEW
80.0000 mg | CHEWABLE_TABLET | Freq: Three times a day (TID) | ORAL | Status: DC
  Administered 2014-06-27 – 2014-06-29 (×8): 80 mg via ORAL
  Filled 2014-06-27 (×8): qty 1

## 2014-06-27 MED ORDER — ONDANSETRON HCL 4 MG PO TABS: 4.0000 mg | ORAL_TABLET | ORAL | Status: DC | PRN

## 2014-06-27 MED ORDER — LACTATED RINGERS IV SOLN
INTRAVENOUS | Status: DC
  Administered 2014-06-27 (×2): via INTRAVENOUS

## 2014-06-27 MED ORDER — DIPHENHYDRAMINE HCL 25 MG PO CAPS: 25.0000 mg | ORAL_CAPSULE | Freq: Four times a day (QID) | ORAL | Status: DC | PRN

## 2014-06-27 MED ORDER — DIPHENHYDRAMINE HCL 25 MG PO CAPS: 25.0000 mg | ORAL_CAPSULE | ORAL | Status: DC | PRN

## 2014-06-27 MED ORDER — ONDANSETRON HCL 4 MG/2ML IJ SOLN: 4.0000 mg | INTRAMUSCULAR | Status: DC | PRN

## 2014-06-27 MED ORDER — LANOLIN HYDROUS EX OINT
1.0000 | TOPICAL_OINTMENT | CUTANEOUS | Status: DC | PRN
Start: 2014-06-27 — End: 2014-06-29

## 2014-06-27 MED ORDER — DIPHENHYDRAMINE HCL 50 MG/ML IJ SOLN: 12.5000 mg | INTRAMUSCULAR | Status: DC | PRN

## 2014-06-27 MED ORDER — SCOPOLAMINE 1 MG/3DAYS TD PT72: 1.0000 | MEDICATED_PATCH | Freq: Once | TRANSDERMAL | Status: DC

## 2014-06-27 MED ORDER — ZOLPIDEM TARTRATE 5 MG PO TABS: 5.0000 mg | ORAL_TABLET | Freq: Every evening | ORAL | Status: DC | PRN

## 2014-06-27 MED ORDER — IBUPROFEN 600 MG PO TABS
600.0000 mg | ORAL_TABLET | Freq: Four times a day (QID) | ORAL | Status: DC
  Administered 2014-06-27 – 2014-06-29 (×9): 600 mg via ORAL
  Filled 2014-06-27 (×9): qty 1

## 2014-06-27 MED ORDER — SENNOSIDES-DOCUSATE SODIUM 8.6-50 MG PO TABS
2.0000 | ORAL_TABLET | ORAL | Status: DC
  Administered 2014-06-27 – 2014-06-28 (×3): 2 via ORAL
  Filled 2014-06-27 (×3): qty 2

## 2014-06-27 MED ORDER — OXYTOCIN 40 UNITS IN LACTATED RINGERS INFUSION - SIMPLE MED: 62.5000 mL/h | INTRAVENOUS | Status: AC

## 2014-06-27 MED ORDER — DIPHENHYDRAMINE HCL 50 MG/ML IJ SOLN
25.0000 mg | INTRAMUSCULAR | Status: DC | PRN
  Filled 2014-06-27: qty 1

## 2014-06-27 MED ORDER — OXYCODONE-ACETAMINOPHEN 5-325 MG PO TABS
1.0000 | ORAL_TABLET | ORAL | Status: DC | PRN
  Administered 2014-06-28: 1 via ORAL
  Filled 2014-06-27: qty 1

## 2014-06-27 MED ORDER — WITCH HAZEL-GLYCERIN EX PADS: 1.0000 "application " | MEDICATED_PAD | CUTANEOUS | Status: DC | PRN

## 2014-06-27 MED ORDER — CLINDAMYCIN PHOSPHATE 900 MG/50ML IV SOLN
900.0000 mg | Freq: Three times a day (TID) | INTRAVENOUS | Status: DC
  Administered 2014-06-27 – 2014-06-28 (×6): 900 mg via INTRAVENOUS
  Filled 2014-06-27 (×8): qty 50

## 2014-06-27 MED ORDER — NALBUPHINE HCL 10 MG/ML IJ SOLN: 5.0000 mg | INTRAMUSCULAR | Status: DC | PRN

## 2014-06-27 MED ORDER — DEXTROSE 5 % IV SOLN
1.0000 g | INTRAVENOUS | Status: DC
  Administered 2014-06-27 – 2014-06-28 (×2): 1 g via INTRAVENOUS
  Filled 2014-06-27 (×3): qty 10

## 2014-06-27 MED ORDER — PRENATAL MULTIVITAMIN CH
1.0000 | ORAL_TABLET | Freq: Every day | ORAL | Status: DC
  Administered 2014-06-27 – 2014-06-29 (×3): 1 via ORAL
  Filled 2014-06-27 (×3): qty 1

## 2014-06-27 MED ORDER — METOCLOPRAMIDE HCL 5 MG/ML IJ SOLN: 10.0000 mg | Freq: Three times a day (TID) | INTRAMUSCULAR | Status: DC | PRN

## 2014-06-27 MED ORDER — SIMETHICONE 80 MG PO CHEW
80.0000 mg | CHEWABLE_TABLET | ORAL | Status: DC
  Administered 2014-06-27 – 2014-06-28 (×3): 80 mg via ORAL
  Filled 2014-06-27 (×3): qty 1

## 2014-06-27 MED ORDER — DEXTROSE 5 % IV SOLN
1.0000 ug/kg/h | INTRAVENOUS | Status: DC | PRN
Start: 2014-06-27 — End: 2014-06-29
  Filled 2014-06-27: qty 2

## 2014-06-27 MED ORDER — SIMETHICONE 80 MG PO CHEW: 80.0000 mg | CHEWABLE_TABLET | ORAL | Status: DC | PRN

## 2014-06-27 MED ORDER — SODIUM CHLORIDE 0.9 % IJ SOLN
3.0000 mL | INTRAMUSCULAR | Status: DC | PRN
  Administered 2014-06-28: 3 mL via INTRAVENOUS

## 2014-06-27 MED ORDER — MENTHOL 3 MG MT LOZG: 1.0000 | LOZENGE | OROMUCOSAL | Status: DC | PRN

## 2014-06-27 MED ORDER — TETANUS-DIPHTH-ACELL PERTUSSIS 5-2.5-18.5 LF-MCG/0.5 IM SUSP
0.5000 mL | Freq: Once | INTRAMUSCULAR | Status: AC
  Administered 2014-06-27: 0.5 mL via INTRAMUSCULAR

## 2014-06-27 NOTE — Lactation Note (Signed)
This note was copied from the chart of Susan Solomon. Lactation Consultation Note     Initial consult with this mom of preterm baby in NICU, now 58 hours old. Mom has been pumping every 3 hours. I showed mom how to do hand expression, and she was able to express about 0.5 mls of colostrum. Mom very motivated to breast feed. I told her to hold her baby skin to skin and attempt breastfeeding. i told her i left a note for Vernona Rieger to work with mom in the nICU tmorrow. i also advied mom to have her baby's nurse call her when the baby cues, so she could breast feed her.   Patient Name: Susan Solomon [id number] Date: 06/27/2014 Reason for consult: Initial assessment;NICU baby   Maternal Data Formula Feeding for Exclusion: Yes (baby in NICU) Has patient been taught Hand Expression?: Yes Does the patient have breastfeeding experience prior to this delivery?: No  Feeding Feeding Type: Formula Nipple Type: Slow - flow Length of feed: 10 min  LATCH Score/Interventions                      Lactation Tools Discussed/Used Tools: Pump Breast pump type: Double-Electric Breast Pump WIC Program: Yes (info to be faxed to [facility]) Pump Review: Setup, frequency, and cleaning;Milk Storage;Other (comment) (hand expression, NICU booklet, premie setting) Initiated by:: bedside rn Date initiated:: 06/26/14   Consult Status Consult Status: Follow-up Date: 06/28/14 Follow-up type: In-patient    Alfred Levins 06/27/2014, 7:26 PM

## 2014-06-27 NOTE — Progress Notes (Signed)
Subjective: Postpartum Day 0-2: Cesarean Delivery Patient reports nausea, incisional pain and tolerating PO.    Objective: Vital signs in last 24 hours: Temp:  [97.7 F (36.5 C)-101.1 F (38.4 C)] 98.7 F (37.1 C) (09/15 0455) Pulse Rate:  [25-107] 85 (09/15 0455) Resp:  [12-20] 16 (09/15 0455) BP: (85-136)/(44-93) 114/74 mmHg (09/15 0455) SpO2:  [98 %-100 %] 100 % (09/15 0455)  Physical Exam:  General: alert, cooperative and no distress Lochia: appropriate Uterine Fundus: firm Incision: healing well, no significant drainage, no dehiscence, no significant erythema DVT Evaluation: No evidence of DVT seen on physical exam. Negative Homan's sign. No cords or calf tenderness. SCDs on and running   Recent Labs  06/26/14 0605 06/27/14 0549  HGB 12.0 11.3*  HCT 37.3 34.8*    Assessment/Plan: Status post Cesarean section. Doing well postoperatively.  Continue current care. Encouraged ambulation today  Essie Hart STACIA 06/27/2014, 9:37 AM

## 2014-06-27 NOTE — Anesthesia Postprocedure Evaluation (Signed)
  Anesthesia Post-op Note  Patient: Susan Solomon  Procedure(s) Performed: Procedure(s): CESAREAN SECTION (N/A)  Patient Location: Mother/Baby  Anesthesia Type:Epidural  Level of Consciousness: awake, alert  and oriented  Airway and Oxygen Therapy: Patient Spontanous Breathing  Post-op Pain: none  Post-op Assessment: Post-op Vital signs reviewed, Patient's Cardiovascular Status Stable, Respiratory Function Stable, No headache, No backache, No residual numbness and No residual motor weakness  Post-op Vital Signs: Reviewed and stable  Last Vitals:  Filed Vitals:   06/27/14 0455  BP: 114/74  Pulse: 85  Temp: 37.1 C  Resp: 16    Complications: No apparent anesthesia complications

## 2014-06-27 NOTE — Addendum Note (Signed)
Addendum created 06/27/14 0827 by Shanon Payor, CRNA   Modules edited: Notes Section   Notes Section:  File: [id number]

## 2014-06-28 ENCOUNTER — Inpatient Hospital Stay (HOSPITAL_COMMUNITY): Admission: RE | Admit: 2014-06-28 | Payer: Medicaid Other | Source: Ambulatory Visit

## 2014-06-28 NOTE — Op Note (Signed)
Cesarean Section Procedure Note  Pre-operative Diagnosis: 1. Intrauterine pregnancy at [redacted]w[redacted]d 2. Arrest of descent 3. Chorioamnonitis  Post-operative Diagnosis: 1. Intrauterine pregnancy at [redacted]w[redacted]d 2. Arrest of descent 3. Chorioamnonitis  Surgeon: Marlow Baars, MD  Assistants: Harold Hedge II, MD  Anesthesia: Epidural anesthesia  Estimated Blood Loss:         Drains: Foley catheter         Specimens: Placenta to pathology         Implants: none         Complications:  None; patient tolerated the procedure well.         Disposition: PACU - hemodynamically stable.  Findings:  Normal uterus, tubes and ovaries bilaterally.  Viable female infant, 3645g, Apgars 2, 9.  Attempts to obtain an arterial cord gas for analysis were unsuccessful.    Procedure Details   The patient was counseled about the risks, benefits, complications of the cesarean section. Consent was obtained.  After making the decision to proceed to the operating room, the start time was delayed by 2.5 hrs waiting for an available operating room.  During this time, fetal status remained reassuring.  A maternal temperature of 101F was noted along with maternal tachycardia.  A diagnosis of chorioamnionitis was made. Ampicillin, gentamicin and tylenol were administered prior to the OR.     After epidural was found to adequate , the patient was placed in the dorsal supine position with a leftward tilt, draped and prepped in the usual sterile manner. A Pfannenstiel incision was made and carried down through the subcutaneous tissue to the fascia.  The fascia was incised in the midline and the fascial incision was extended laterally with Mayo scissors. The superior aspect of the fascial incision was grasped with two Kocher clamp, tented up and the rectus muscles dissected off bluntly. The rectus was then dissected off with blunt dissection and Mayo scissors inferiorly. The rectus muscles were separated in the midline. The abdominal  peritoneum was identified, tented up, entered sharply, and the incision was extended superiorly and inferiorly with good visualization of the bladder. The vesicouterine peritoneum was identified, tented up, entered sharply with Metzenbaum scissors, and the bladder flap was created digitally. Scalpel was then used to make a low transverse incision on the uterus which was extended laterally with  blunt dissection. Upon hysterotomy, the amniotic fluid was very cloudy, consistent with intrauterine infection. The fetal vertex was identified. The head was brought out from the pelvis.  The head was then delivered easily through the uterine incision followed by the body. A live female infant was bulb suctioned on the operative field, the cord was clamped and cut and the infant was passed to the waiting neonatologist.  Placenta was then delivered spontaneously, intact and appear normal, the uterus was cleared of all clot and debris.   Bilaterally, extensions of the hysterotomy to the cervix were identified.  Posterior, there was a separation of the endometrium from the underlying myometrium.  This was repaired with two figure of eight sutures of 0-monocryl.  The posterior aspect of the uterus was examined and was in tact without myometrial defects.  The hysterotomy was then repaired with #0 Monocryl in running locked fashion. A second imbricating suture was performed using the same suture. Care was taken to avoid placing sutures laterally when repairing the cervical extensions.  3 additional figure of eight sutures were required for hemostasis.  The serosal edges of the incision were oozy, and bovie cautery was used to  achieve hemostasis.  Surgicel was placed over the hysterotomy.  The abdominal cavity was cleared of all clot and debris. The fascia and rectus muscles were inspected and were hemostatic. The fascia was closed with 0 Vicryl in a running fashion. The subcuticular layer was irrigated and all bleeders  cauterized.  The subcutaneous layer was re approximated with interrupted 3-0 plain gut.  The skin was closed with 3-0 monocryl in a subcuticular fashion. The incision was dressed with benzoine, steri strips and pressure dressing. All sponge lap and needle counts were correct x3. Patient tolerated the procedure well and recovered in stable condition following the procedure. Dr. Henderson Cloud was scrubbed as an Designer, television/film set from the delivery of the infant to closure of the hysterotomy.

## 2014-06-28 NOTE — Lactation Note (Signed)
This note was copied from the chart of Susan Brilee Port. Lactation Consultation Note  Called to NICU to assist with feeding.  Baby was latched on when I arrived but sliding off and on nipple.  Demonstrated to mom how to position baby using cross cradle hold.  Instructed on waiting for a wide open mouth and bringing baby to breast. Baby latches easily and sucks actively but tends to slip at times and needs relatched.  Instructed on keeping baby close during feeding and using off and on breast massage and compression.  Mom continues to pump every 3 hours but not obtaining any milk yet.  Reassured and encouraged to continue pumping every three hours.  Will continue to follow.  Patient Name: Susan Solomon [id number] Date: 06/28/2014 Reason for consult: Follow-up assessment   Maternal Data    Feeding Feeding Type: Breast Fed Length of feed: 25 min  LATCH Score/Interventions Latch: Grasps breast easily, tongue down, lips flanged, rhythmical sucking.  Audible Swallowing: A few with stimulation  Type of Nipple: Everted at rest and after stimulation  Comfort (Breast/Nipple): Soft / non-tender     Hold (Positioning): Assistance needed to correctly position infant at breast and maintain latch. Intervention(s): Breastfeeding basics reviewed;Support Pillows;Position options  LATCH Score: 8  Lactation Tools Discussed/Used     Consult Status Consult Status: Follow-up Date: 06/29/14 Follow-up type: In-patient    Huston Foley 06/28/2014, 3:02 PM

## 2014-06-29 MED ORDER — IBUPROFEN 600 MG PO TABS: 600.0000 mg | ORAL_TABLET | Freq: Four times a day (QID) | ORAL | Status: DC | PRN

## 2014-06-29 MED ORDER — OXYCODONE-ACETAMINOPHEN 5-325 MG PO TABS: 1.0000 | ORAL_TABLET | Freq: Four times a day (QID) | ORAL | Status: DC | PRN

## 2014-06-29 NOTE — Progress Notes (Signed)
  Patient is eating, ambulating, voiding.  Pain control is good. Lochia is minimal  Filed Vitals:   06/28/14 1200 06/28/14 1720 06/28/14 2255 06/29/14 0605  BP: 94/59 89/45 106/55 115/55  Pulse: 81 86 75 72  Temp: 98.5 F (36.9 C) 98.3 F (36.8 C) 99.1 F (37.3 C) 98.4 F (36.9 C)  TempSrc: Oral Oral Oral Oral  Resp: Height:      Weight:      SpO2: 100% 100% 100% 100%    lungs:   clear to auscultation heart: RRR Abdomen:  soft, appropriate tenderness, incisions intact and without erythema or exudate ex:    Tr edema bilaterally, neg Homan's sign  Lab Results  Component Value Date   WBC 18.4* 06/27/2014   HGB 11.3* 06/27/2014   HCT 34.8* 06/27/2014   MCV 77.7* 06/27/2014   PLT 228 06/27/2014   O+/rubella immune  A/P    Post operative day 3 s/p 1 LTCS 2/2 arrest of descent, c/b chorio  S/p 24hr post op abx, afebrile since day of delivery.  Meeting all goals.    D/c today  Baby doing well, undergoing sepsis w/u in NICU.

## 2014-06-29 NOTE — Progress Notes (Signed)
Pt. Is discharged in the care of Mother. Downstairs per ambulatory with N.T. Escort.Discharge instructions with Rx were given to Pt. Understands all instructionsd well. Denies any pain or discomfort. No equipment needed for home use. Stable.

## 2014-06-29 NOTE — Discharge Instructions (Signed)

## 2014-06-29 NOTE — Lactation Note (Signed)
This note was copied from the chart of Susan Kaydan Wilhoite. Lactation Consultation Note     Follow up consult with this mom of a term baby, in NICU, and now 57 hours old. Mom reports that her milk is transitioning in, and hse is expressing increasing amounts. She is being discharged to home today, and has a personal DEP. She was advised to pump 15-30 minutes now, followed by hand expression. MOm would lkie assistacne with latching her baby, so when the baby next feeds and mom is there, the baby's nurse will call me. Mom knows to have lactation called for questions/concerns.   Patient Name: Susan Solomon Date: 06/29/2014 Reason for consult: Follow-up assessment;NICU baby   Maternal Data    Feeding Feeding Type: Formula Nipple Type: Fast - flow Length of feed: 20 min  LATCH Score/Interventions                      Lactation Tools Discussed/Used     Consult Status Consult Status: PRN Follow-up type: In-patient (NICU)    Susan Solomon 06/29/2014, 12:42 PM

## 2014-06-29 NOTE — Discharge Summary (Signed)
Obstetric Discharge Summary Reason for Admission: onset of labor Prenatal Procedures: none Intrapartum Procedures: cesarean: low cervical, transverse for arrest of descent Postpartum Procedures: antibiotics Complications-Operative and Postpartum: chorioamnionitis Hemoglobin  Date Value Ref Range Status  06/27/2014 11.3* 12.0 - 15.0 g/dL Final     HCT  Date Value Ref Range Status  06/27/2014 34.8* 36.0 - 46.0 % Final    Physical Exam:  General: alert, cooperative and no distress Lochia: appropriate Uterine Fundus: firm Incision: healing well DVT Evaluation: No evidence of DVT seen on physical exam.  Discharge Diagnoses: Term Pregnancy-delivered  Discharge Information: Date: 06/29/2014 Activity: pelvic rest Diet: routine Medications: Ibuprofen, Colace and Percocet Condition: stable Instructions: refer to practice specific booklet Discharge to: home Follow-up Information   Follow up with Marlow Baars, MD In 2 weeks. (incision check)    Specialty:  Obstetrics   Contact information:   [address] [place] [address] [phone]       Newborn Data: Live born female  Birth Weight: 8 lb 0.6 oz (3645 g) APGAR: 2, 9  Baby doing well in NICU, undergoing sepsis w/u.   Marlow Baars 06/29/2014, 9:39 AM

## 2014-06-30 NOTE — Progress Notes (Signed)
CSW attempted to meet with MOB to introduce CSW support services and complete assessment for NICU admission, but she was not in her room at this time.  CSW attempted to meet with her at baby's bedside, since MOB is discharging this afternoon, but she was working with the lactation consultant behind the curtain.  CSW reviewed chart and does not identify any social stressors.  CSW will attempt to follow up with MOB when she visits with baby if possible. 

## 2014-07-18 ENCOUNTER — Emergency Department (HOSPITAL_COMMUNITY)
Admission: EM | Admit: 2014-07-18 | Discharge: 2014-07-19 | Disposition: A | Discharge status: Home / Self Care | Payer: 59 | Attending: Emergency Medicine | Admitting: Emergency Medicine

## 2014-07-18 ENCOUNTER — Encounter (HOSPITAL_COMMUNITY): Payer: Self-pay | Admitting: Emergency Medicine

## 2014-07-18 DIAGNOSIS — K5289 Other specified noninfective gastroenteritis and colitis: Secondary | ICD-10-CM | POA: Insufficient documentation

## 2014-07-18 DIAGNOSIS — Z79899 Other long term (current) drug therapy: Secondary | ICD-10-CM | POA: Diagnosis not present

## 2014-07-18 DIAGNOSIS — Z87891 Personal history of nicotine dependence: Secondary | ICD-10-CM | POA: Diagnosis not present

## 2014-07-18 DIAGNOSIS — Z3202 Encounter for pregnancy test, result negative: Secondary | ICD-10-CM | POA: Insufficient documentation

## 2014-07-18 DIAGNOSIS — Z87440 Personal history of urinary (tract) infections: Secondary | ICD-10-CM | POA: Insufficient documentation

## 2014-07-18 DIAGNOSIS — K529 Noninfective gastroenteritis and colitis, unspecified: Secondary | ICD-10-CM

## 2014-07-18 DIAGNOSIS — R109 Unspecified abdominal pain: Secondary | ICD-10-CM | POA: Diagnosis present

## 2014-07-18 LAB — COMPREHENSIVE METABOLIC PANEL
ALT: 8 U/L (ref 0–35)
AST: 14 U/L (ref 0–37)
Albumin: 4 g/dL (ref 3.5–5.2)
Alkaline Phosphatase: 87 U/L (ref 39–117)
Anion gap: 17 — ABNORMAL HIGH (ref 5–15)
BUN: 7 mg/dL (ref 6–23)
CO2: 21 mEq/L (ref 19–32)
Calcium: 9.6 mg/dL (ref 8.4–10.5)
Chloride: 98 mEq/L (ref 96–112)
Creatinine, Ser: 0.64 mg/dL (ref 0.50–1.10)
GFR calc Af Amer: >90 mL/min (ref >90)
GFR calc non Af Amer: >90 mL/min (ref >90)
Glucose, Bld: 66 mg/dL — ABNORMAL LOW (ref 70–99)
Potassium: 3.3 mEq/L — ABNORMAL LOW (ref 3.7–5.3)
Sodium: 136 mEq/L — ABNORMAL LOW (ref 137–147)
Total Bilirubin: 0.2 mg/dL — ABNORMAL LOW (ref 0.3–1.2)
Total Protein: 8.6 g/dL — ABNORMAL HIGH (ref 6.0–8.3)

## 2014-07-18 LAB — CBC WITH DIFFERENTIAL/PLATELET
Basophils Absolute: 0 10*3/uL (ref 0.0–0.1)
Basophils Relative: 0 % (ref 0–1)
Eosinophils Absolute: 0 10*3/uL (ref 0.0–0.7)
Eosinophils Relative: 0 % (ref 0–5)
HCT: 41.4 % (ref 36.0–46.0)
Hemoglobin: 13.3 g/dL (ref 12.0–15.0)
Lymphocytes Relative: 24 % (ref 12–46)
Lymphs Abs: 2.7 10*3/uL (ref 0.7–4.0)
MCH: 24.6 pg — ABNORMAL LOW (ref 26.0–34.0)
MCHC: 32.1 g/dL (ref 30.0–36.0)
MCV: 76.7 fL — ABNORMAL LOW (ref 78.0–100.0)
Monocytes Absolute: 1 10*3/uL (ref 0.1–1.0)
Monocytes Relative: 9 % (ref 3–12)
Neutro Abs: 7.5 10*3/uL (ref 1.7–7.7)
Neutrophils Relative %: 67 % (ref 43–77)
Platelets: 391 10*3/uL (ref 150–400)
RBC: 5.4 MIL/uL — ABNORMAL HIGH (ref 3.87–5.11)
RDW: 17 % — ABNORMAL HIGH (ref 11.5–15.5)
WBC: 11.2 10*3/uL — ABNORMAL HIGH (ref 4.0–10.5)

## 2014-07-18 LAB — URINALYSIS, ROUTINE W REFLEX MICROSCOPIC
Bilirubin Urine: NEGATIVE
Glucose, UA: NEGATIVE mg/dL
Hgb urine dipstick: NEGATIVE
Ketones, ur: >80 mg/dL — AB
Nitrite: NEGATIVE
Protein, ur: 30 mg/dL — AB
Specific Gravity, Urine: 1.022 (ref 1.005–1.030)
Urobilinogen, UA: 0.2 mg/dL (ref 0.0–1.0)
pH: 6.5 (ref 5.0–8.0)

## 2014-07-18 LAB — URINE MICROSCOPIC-ADD ON

## 2014-07-18 LAB — LIPASE, BLOOD: Lipase: 38 U/L (ref 11–59)

## 2014-07-18 LAB — PREGNANCY, URINE: Preg Test, Ur: NEGATIVE

## 2014-07-18 MED ORDER — SODIUM CHLORIDE 0.9 % IV BOLUS (SEPSIS)
1000.0000 mL | Freq: Once | INTRAVENOUS | Status: AC
  Administered 2014-07-18: 1000 mL via INTRAVENOUS

## 2014-07-18 MED ORDER — ONDANSETRON HCL 4 MG/2ML IJ SOLN
4.0000 mg | Freq: Once | INTRAMUSCULAR | Status: AC
  Administered 2014-07-18: 4 mg via INTRAVENOUS
  Filled 2014-07-18: qty 2

## 2014-07-18 NOTE — ED Notes (Signed)
Pt reports low abd pain with n/v/d since Thursday.  Pt reports having a baby x 3 weeks ago. Pt denies urinary sxs or vaginal d/c at this time

## 2014-07-18 NOTE — ED Provider Notes (Signed)
CSN: [id number]     Arrival date & time 07/18/14  1735 History   First MD Initiated Contact with Patient 07/18/14 2138     Chief Complaint  Patient presents with  . Abdominal Pain     (Consider location/radiation/quality/duration/timing/severity/associated sxs/prior Treatment) HPI Susan Solomon is a 28 year old female who is postop C-section 06/26/14 presenting tonight with nausea or vomiting diarrhea. Patient states last week on Wednesday or Thursday began having nausea, vomiting, diarrhea. Patient states her symptoms have persisted until this week. Patient states her nausea and vomiting have gotten the point that she is unable to keep any food or liquids down. Patient denies any abdominal pain, fever, shortness of breath, chest pain, hematochezia, melena, vaginal discharge. Patient reports a mild vaginal spotting, which she states has been consistently improving since her C-section. Past Medical History  Diagnosis Date  . Abnormal Pap smear 03/29/2013    ASC-US +HPV  . Infection     UTI  . Vaginal Pap smear, abnormal    Past Surgical History  Procedure Laterality Date  . No past surgeries    . Cesarean section N/A 06/26/2014    Procedure: CESAREAN SECTION;  Surgeon: Marlow Baars, MD;  Location: [facility];  Service: Obstetrics;  Laterality: N/A;   Family History  Problem Relation Age of Onset  . Cancer Father     colon  . Cancer Paternal Aunt     colon  . Cancer Paternal Uncle     colon   History  Substance Use Topics  . Smoking status: Former Games developer  . Smokeless tobacco: Never Used     Comment: prior to preg  . Alcohol Use: No   OB History   Grav Para Term Preterm Abortions TAB SAB Ect Mult Living   1 1 1       1      Review of Systems  Constitutional: Negative for fever.  HENT: Negative for trouble swallowing.   Eyes: Negative for visual disturbance.  Respiratory: Negative for shortness of breath.   Cardiovascular: Negative for chest pain.  Gastrointestinal: Positive  for nausea, vomiting and diarrhea. Negative for abdominal pain.  Genitourinary: Negative for dysuria.  Musculoskeletal: Negative for neck pain.  Skin: Negative for rash.  Neurological: Negative for dizziness, weakness and numbness.  Psychiatric/Behavioral: Negative.       Allergies  Review of patient's allergies indicates no known allergies.  Home Medications   Prior to Admission medications   Medication Sig Start Date End Date Taking? Authorizing Provider  ferrous sulfate 325 (65 FE) MG tablet Take 325 mg by mouth daily with breakfast.   Yes Historical Provider, MD  ibuprofen (ADVIL,MOTRIN) 600 MG tablet Take 1 tablet (600 mg total) by mouth every 6 (six) hours as needed. 06/29/14  Yes Marlow Baars, MD  Prenatal Vit-Min-FA-Fish Oil ([employer] PRENATAL GUMMY PO) Take 2 tablets by mouth daily.   Yes Historical Provider, MD  ondansetron (ZOFRAN) 4 MG tablet Take 1 tablet (4 mg total) by mouth every 8 (eight) hours as needed for nausea or vomiting. 07/19/14   Susan Fantasia, PA-C   BP 110/60  Pulse 71  Temp(Src) 98 F (36.7 C) (Oral)  Resp 15  SpO2 99% Physical Exam  Nursing note and vitals reviewed. Constitutional: She is oriented to person, place, and time. She appears well-developed and well-nourished. No distress.  HENT:  Head: Normocephalic and atraumatic.  Mouth/Throat: Oropharynx is clear and moist. No oropharyngeal exudate.  Eyes: Right eye exhibits no discharge. Left eye exhibits no discharge.  No scleral icterus.  Neck: Normal range of motion.  Cardiovascular: Normal rate, regular rhythm and normal heart sounds.   No murmur heard. Pulmonary/Chest: Effort normal and breath sounds normal. No respiratory distress.  Abdominal: Soft. Normal appearance. There is no tenderness.    Well healed, nonerythematous, nonedematous, surgical wound noted in the lower abdomen consistent with her C-section incision. No obvious signs of infection.  Musculoskeletal: Normal range of motion. She  exhibits no edema and no tenderness.  Neurological: She is alert and oriented to person, place, and time. No cranial nerve deficit. Coordination normal.  Skin: Skin is warm and dry. No rash noted. She is not diaphoretic.  Psychiatric: She has a normal mood and affect.    ED Course  Procedures (including critical care time) Labs Review Labs Reviewed  CBC WITH DIFFERENTIAL - Abnormal; Notable for the following:    WBC 11.2 (*)    RBC 5.40 (*)    MCV 76.7 (*)    MCH 24.6 (*)    RDW 17.0 (*)    All other components within normal limits  COMPREHENSIVE METABOLIC PANEL - Abnormal; Notable for the following:    Sodium 136 (*)    Potassium 3.3 (*)    Glucose, Bld 66 (*)    Total Protein 8.6 (*)    Total Bilirubin 0.2 (*)    Anion gap 17 (*)    All other components within normal limits  URINALYSIS, ROUTINE W REFLEX MICROSCOPIC - Abnormal; Notable for the following:    Ketones, ur >80 (*)    Protein, ur 30 (*)    Leukocytes, UA TRACE (*)    All other components within normal limits  PREGNANCY, URINE  LIPASE, BLOOD  URINE MICROSCOPIC-ADD ON    Imaging Review No results found.   EKG Interpretation None      MDM   Final diagnoses:  Gastroenteritis   28 year old female with nausea, vomiting, diarrhea x6 days. No recent travel, no recent sick contacts, history and physical exam non-concerning for acute abdomen. Patient nontoxic, well-appearing, no obvious distress, sitting comfortably on stretcher in the room.  12:20 AM: Patient stating she is a symptomatically this time. We will give patient PO trial and discharge. I discussed return precautions with patient, and encourage her to follow up with her primary care physician. We'll provide patient with resource guide to help her find one. Patient states she has an appointment with her OB/GYN next week for postop followup. Patient agreeable to this plan. Patient with symptoms consistent with viral gastroenteritis.  Vitals are stable,  no fever.  No signs of dehydration, tolerating PO fluids > 6 oz.  Lungs are clear.  No focal abdominal pain, no concern for appendicitis, cholecystitis, pancreatitis, ruptured viscus, UTI, kidney stone, or any other abdominal etiology.  Supportive therapy indicated with return if symptoms worsen.  Patient counseled.  BP 110/60  Pulse 71  Temp(Src) 98 F (36.7 C) (Oral)  Resp 15  SpO2 99%  Signed,  Ladona MowJoe Avryl Roehm, PA-C 2:06 AM  This patient seen and discussed with Dr. Azalia BilisKevin Campos, M.D.    Susan FantasiaJoseph W Andriy Sherk, PA-C 07/19/14 0206

## 2014-07-19 DIAGNOSIS — K5289 Other specified noninfective gastroenteritis and colitis: Secondary | ICD-10-CM | POA: Diagnosis not present

## 2014-07-19 MED ORDER — ONDANSETRON HCL 4 MG PO TABS: 4.0000 mg | ORAL_TABLET | Freq: Three times a day (TID) | ORAL | Status: DC | PRN

## 2014-07-19 NOTE — ED Provider Notes (Signed)
Medical screening examination/treatment/procedure(s) were performed by non-physician practitioner and as supervising physician I was immediately available for consultation/collaboration.   EKG Interpretation None        Bera Pinela M Dalton Molesworth, MD 07/19/14 2328 

## 2014-07-19 NOTE — Discharge Instructions (Signed)
Viral Gastroenteritis °Viral gastroenteritis is also known as stomach flu. This condition affects the stomach and intestinal tract. It can cause sudden diarrhea and vomiting. The illness typically lasts 3 to 8 days. Most people develop an immune response that eventually gets rid of the virus. While this natural response develops, the virus can make you quite ill. °CAUSES  °Many different viruses can cause gastroenteritis, such as rotavirus or noroviruses. You can catch one of these viruses by consuming contaminated food or water. You may also catch a virus by sharing utensils or other personal items with an infected person or by touching a contaminated surface. °SYMPTOMS  °The most common symptoms are diarrhea and vomiting. These problems can cause a severe loss of body fluids (dehydration) and a body salt (electrolyte) imbalance. Other symptoms may include: °· Fever. °· Headache. °· Fatigue. °· Abdominal pain. °DIAGNOSIS  °Your caregiver can usually diagnose viral gastroenteritis based on your symptoms and a physical exam. A stool sample may also be taken to test for the presence of viruses or other infections. °TREATMENT  °This illness typically goes away on its own. Treatments are aimed at rehydration. The most serious cases of viral gastroenteritis involve vomiting so severely that you are not able to keep fluids down. In these cases, fluids must be given through an intravenous line (IV). °HOME CARE INSTRUCTIONS  °· Drink enough fluids to keep your urine clear or pale yellow. Drink small amounts of fluids frequently and increase the amounts as tolerated. °· Ask your caregiver for specific rehydration instructions. °· Avoid: °¨ Foods high in sugar. °¨ Alcohol. °¨ Carbonated drinks. °¨ Tobacco. °¨ Juice. °¨ Caffeine drinks. °¨ Extremely hot or cold fluids. °¨ Fatty, greasy foods. °¨ Too much intake of anything at one time. °¨ Dairy products until 24 to 48 hours after diarrhea stops. °· You may consume probiotics.  Probiotics are active cultures of beneficial bacteria. They may lessen the amount and number of diarrheal stools in adults. Probiotics can be found in yogurt with active cultures and in supplements. °· Wash your hands well to avoid spreading the virus. °· Only take over-the-counter or prescription medicines for pain, discomfort, or fever as directed by your caregiver. Do not give aspirin to children. Antidiarrheal medicines are not recommended. °· Ask your caregiver if you should continue to take your regular prescribed and over-the-counter medicines. °· Keep all follow-up appointments as directed by your caregiver. °SEEK IMMEDIATE MEDICAL CARE IF:  °· You are unable to keep fluids down. °· You do not urinate at least once every 6 to 8 hours. °· You develop shortness of breath. °· You notice blood in your stool or vomit. This may look like coffee grounds. °· You have abdominal pain that increases or is concentrated in one small area (localized). °· You have persistent vomiting or diarrhea. °· You have a fever. °· The patient is a child younger than 3 months, and he or she has a fever. °· The patient is a child older than 3 months, and he or she has a fever and persistent symptoms. °· The patient is a child older than 3 months, and he or she has a fever and symptoms suddenly get worse. °· The patient is a baby, and he or she has no tears when crying. °MAKE SURE YOU:  °· Understand these instructions. °· Will watch your condition. °· Will get help right away if you are not doing well or get worse. °Document Released: 09/29/2005 Document Revised: 12/22/2011 Document Reviewed: 07/16/2011 °  ExitCare® Patient Information ©2015 [employer]. This information is not intended to replace advice given to you by your health care provider. Make sure you discuss any questions you have with your health care provider. ° ° °Emergency Department Resource Guide °1) Find a Doctor and Pay Out of Pocket °Although you won't have to find  out who is covered by your insurance plan, it is a good idea to ask around and get recommendations. You will then need to call the office and see if the doctor you have chosen will accept you as a new patient and what types of options they offer for patients who are self-pay. Some doctors offer discounts or will set up payment plans for their patients who do not have insurance, but you will need to ask so you aren't surprised when you get to your appointment. ° °2) Contact Your Local Health Department °Not all health departments have doctors that can see patients for sick visits, but many do, so it is worth a call to see if yours does. If you don't know where your local health department is, you can check in your phone book. The CDC also has a tool to help you locate your state's health department, and many state websites also have listings of all of their local health departments. ° °3) Find a Walk-in Clinic °If your illness is not likely to be very severe or complicated, you may want to try a walk in clinic. These are popping up all over the country in pharmacies, drugstores, and shopping centers. They're usually staffed by nurse practitioners or physician assistants that have been trained to treat common illnesses and complaints. They're usually fairly quick and inexpensive. However, if you have serious medical issues or chronic medical problems, these are probably not your best option. ° °No Primary Care Doctor: °- Call [facility] at  832-8000 - they can help you locate a primary care doctor that  accepts your insurance, provides certain services, etc. °- Physician Referral Service- 1-800-533-3463 ° °Chronic Pain Problems: °Organization         Address  Phone   Notes  °[facility]  (336) 297-2271 Patients need to be referred by their primary care doctor.  ° °Medication Assistance: °Organization         Address  Phone   Notes  °[place] [facility] [address] °[place], [place] [address] (336) 641-8030 --Must be a resident of [place] °-- Must have NO insurance coverage whatsoever (no Medicaid/ Medicare, etc.) °-- The pt. MUST have a primary care doctor that directs their care regularly and follows them in the community °  °[facility]  (866) 331-1348   °[employer]  (888) 892-1162   ° °Agencies that provide inexpensive medical care: °Organization         Address  Phone   Notes  °[facility]  (336) 832-8035   °[facility]    (336) 832-7272   °[facility] [address] °[place], [place] [address] (336) 832-4777   °[facility] [address], °[place] (336) 271-4999   °[facility]    (336) 373-0678   °[facility]    (336) 272-1050   °[facility] ° [address], [place] Phone:  (336) 832-4444, Fax:  (336) 832-4440 Hours of Operation:  9 am - 6 pm, M-F.  Also accepts Medicaid/Medicare and self-pay.  °[facility] ° [address], [place] Phone: (336) [phone], Fax: (336) [phone]. Hours of Operation:  8:30 am - 5:30 pm, M-F.  Also accepts Medicaid and self-pay.  °[facility] [place] [address], [place] Phone: (336) 878-6027   °[facility] [address], [place], [place] (336)723-1848, Ext. 123 Mondays & Thursdays: 7-9 AM.  First 15 patients are seen on a first come, first serve basis. °  ° °Medicaid-accepting [place] Providers: ° °Organization         Address  Phone   Notes  °[facility] [address], [place] (336) 641-2100 Also accepts self-pay patients.  °[facility] [address], [place] ° (336) 856-9996   °[facility] [address], [place] (336) 288-8857   °[facility] [address] [place] [address], [place] (336) 299-7000   °Veita Bland  [address], [place]  ° (336) 373-1557 Only accepts [place] Access Medicaid patients after they have their name applied to their card.  ° °Self-Pay (no insurance) in [place]: ° °Organization         Address  Phone   Notes  °Sickle Cell Patients, [facility] [address], [place] (336) 832-1970   °[facility] [address], [place] (336) 832-4400   °[facility] ° [address], [place] (336) 992-4800   °[facility]/Dr. Osei-Bonsu ° [address] [place] [address], [place] or [address], [place] (336) 841-8500 Phone number for both [place] and [place] locations is the same.  °[facility] [address], [place] (336) 299-0000   °[facility] [address] [place] [address], [place] or [address] (336) 852-7530 °(336) 878-2260   °[facility] [address], [place] (336) 350-1642, phone; (336) 294-5005, fax Sees patients 1st and 3rd Saturday of every month.  Must not qualify for public or private insurance (i.e. Medicaid, Medicare, [place] Health Choice, Veterans' Benefits) • Household income should be no more than 200% of the poverty level •The clinic cannot treat you if you are pregnant or think you are pregnant • Sexually transmitted diseases are not treated at the clinic.  ° ° °Dental Care: °Organization         Address  Phone  Notes  °[facility] [facility] [address],  (336) 641-6152 Accepts children up to age 21 who are enrolled in Medicaid or [place] Health Choice; pregnant women with a Medicaid card; and children who have applied for Medicaid or [place] Health Choice, but were declined, whose parents can pay a reduced fee at time of service.  °[facility] [place]  [address], [place] (336) 641-7733 Accepts children up to age 21  who are enrolled in Medicaid or [place] Health Choice; pregnant women with a Medicaid card; and children who have applied for Medicaid or [place] Health Choice, but were declined, whose parents can pay a reduced fee at time of service.  °[facility] ° [address],  (336) 641-4533 Patients are seen by appointment only. Walk-ins are not accepted. [facility] will see patients 18 years of age and older. °Monday - Tuesday (8am-5pm) °Most Wednesdays (8:30-5pm) °$30 per visit, cash   only  °[facility] ° [address], [place] (336) 641-4533 Patients are seen by appointment only. Walk-ins are not accepted. [facility] will see patients 18 years of age and older. °One Wednesday Evening (Monthly: Volunteer Based).  $30 per visit, cash only  °[facility]  (919) 537-3737 for adults; Children under age 4, call Graduate Pediatric Dentistry at ([phone]) [phone]. Children aged 4-14, please call ([phone]) [phone] to request a pediatric application. ° Dental services are provided in all areas of dental care including fillings, crowns and bridges, complete and partial dentures, implants, gum treatment, root canals, and extractions. Preventive care is also provided. Treatment is provided to both adults and children. °Patients are selected via a lottery and there is often a waiting list. °  °[facility] [address], °[place] ° (336) 763-8833 [email or web address] °  °[facility] [address], [place], [place] (336)723-1848, Ext. 123 Second and Fourth Thursday of each month, opens at 6:30 AM; Clinic ends at 9 AM.  Patients are seen on a first-come first-served basis, and a limited number are seen during each clinic.  ° °[facility] ° [address], [place], [place] (336) 723-7904   Eligibility Requirements °You must have lived in [place], [place], or [place] counties for at least the last three months. °   You cannot be eligible for state or federal sponsored healthcare insurance, including Veterans Administration, Medicaid, or Medicare. °  You generally cannot be eligible for healthcare insurance through your employer.  °  How to apply: °Eligibility screenings are held every Tuesday and Wednesday afternoon from 1:00 pm until 4:00 pm. You do not need an appointment for the interview!  °[facility] [address], [place], [place] 336-631-2330   °[facility]  336-342-8273   °[facility]  336-703-3100   °[facility]  336-570-6415   ° °Behavioral Health Resources in the Community: °Intensive Outpatient Programs °Organization         Address  Phone  Notes  °[facility] [address], [place], [place] 336-878-6098   °[facility] [address], [place], [place] 336-832-9800   °[facility] [address], [place], [place] ° 336-882-2125   °[facility] [address],  °[place], [place] 1-800-853-5163 or 336-641-4981   °Substance Abuse Resources °Organization         Address  Phone  Notes  °[facility]  336-882-2125   °[facility]  336-784-9470   °[facility]  336-285-9073   °[facility]  336-845-3988   °Residential & Outpatient Substance Abuse Program  1-800-659-3381   °Psychological Services °Organization         Address  Phone  Notes  °[facility]  336- 832-9600   °[facility]  336- 378-7881   °[facility] [address], [place] 1-800-853-5163 or 336-641-4981   ° °Mobile Crisis Teams °Organization         Address  Phone  Notes  °[facility], Mobile Crisis Care Unit  1-877-626-1772   °Assertive °[facility] ° [address] [place], [place] 336-834-9664   °Sharon DeEsch [address] °[place] 336-554-5454   ° °Self-Help/Support  Groups °Organization         Address  Phone               Notes  °[facility] [place] - variety of support groups  336- 373-1402 Call for more information  °Narcotics Anonymous (NA), [facility] [address], °[place]  2 meetings at this location  ° °Residential Treatment Programs °Organization         Address  Phone  Notes  °[facility] [address],    °[place]  1-866-801-8205   °[facility] ° [address], [place], [place] 704-293-8524   °[facility] [address], [place] 336-845-3988 Admissions: 8am-3pm M-F  °[facility] [address]    °[place], [place] 336-841-1104   °[facility] [address], [place], [place] 336-379-7146   °[facility] [address]  °[place], [place] 336-285-9073   °[facility] Intensive Outpatient [address], [place], [place] 336-852-3033   °[facility] [address]  °[place], [place] 1-877-615-2722 or 336-784-9470   °[facility] [address] [place], [place] 336-227-7417 Accepts Medicaid  °[facility] [address]  ° [place] 1-800-659-3381 Substance Abuse/Addiction Treatment  ° °[place] Behavioral Health Resources °Organization         Address  Phone  Notes  °[facility]  (888) 581-9988   °Julie Brannon, PhD [address] [place], [place]   (336) 349-5553 or (336) 951-0000   °[facility]   [address] °[place], [place] (336) 349-4454   °[facility] [address], [place], [place] (336) 342-8316 Insurance/Medicaid/sponsorship through [facility]  °[facility] [address]                                    [place], [place] (336) 342-8316 Therapy/tele-psych/case  °[facility] [address]  ° [place], [place] (336) 349-2233    °Dr. Arfeen  (336) 349-4544   °[facility] [place]  [facility]  [employer] [place] [facility] 1) [address], [place] °2) [address], [place] °3)  [address], [place] (336) 349-3220 °(336) 342-7768 ° °(336) 342-8140   °[place] Child Abuse [facility] (336) 342-1394 or (336) 342-3537 (After Hours)    ° ° ° °

## 2014-07-20 ENCOUNTER — Inpatient Hospital Stay (HOSPITAL_COMMUNITY)
Admission: AD | Admit: 2014-07-20 | Discharge: 2014-07-20 | Disposition: A | Discharge status: Home / Self Care | Payer: 59 | Source: Ambulatory Visit | Attending: Obstetrics and Gynecology | Admitting: Obstetrics and Gynecology

## 2014-07-20 ENCOUNTER — Encounter (HOSPITAL_COMMUNITY): Payer: Self-pay | Admitting: *Deleted

## 2014-07-20 DIAGNOSIS — Z87891 Personal history of nicotine dependence: Secondary | ICD-10-CM | POA: Diagnosis not present

## 2014-07-20 DIAGNOSIS — K529 Noninfective gastroenteritis and colitis, unspecified: Secondary | ICD-10-CM | POA: Insufficient documentation

## 2014-07-20 DIAGNOSIS — R112 Nausea with vomiting, unspecified: Secondary | ICD-10-CM | POA: Diagnosis present

## 2014-07-20 DIAGNOSIS — A084 Viral intestinal infection, unspecified: Secondary | ICD-10-CM

## 2014-07-20 DIAGNOSIS — E860 Dehydration: Secondary | ICD-10-CM | POA: Diagnosis not present

## 2014-07-20 LAB — CBC
HCT: 40 % (ref 36.0–46.0)
Hemoglobin: 12.9 g/dL (ref 12.0–15.0)
MCH: 25 pg — ABNORMAL LOW (ref 26.0–34.0)
MCHC: 32.3 g/dL (ref 30.0–36.0)
MCV: 77.7 fL — ABNORMAL LOW (ref 78.0–100.0)
Platelets: 332 10*3/uL (ref 150–400)
RBC: 5.15 MIL/uL — ABNORMAL HIGH (ref 3.87–5.11)
RDW: 17.3 % — ABNORMAL HIGH (ref 11.5–15.5)
WBC: 9.3 10*3/uL (ref 4.0–10.5)

## 2014-07-20 LAB — URINE MICROSCOPIC-ADD ON

## 2014-07-20 LAB — COMPREHENSIVE METABOLIC PANEL
ALT: 10 U/L (ref 0–35)
AST: 23 U/L (ref 0–37)
Albumin: 3.8 g/dL (ref 3.5–5.2)
Alkaline Phosphatase: 76 U/L (ref 39–117)
Anion gap: 15 (ref 5–15)
BUN: 4 mg/dL — ABNORMAL LOW (ref 6–23)
CO2: 23 mEq/L (ref 19–32)
Calcium: 9.1 mg/dL (ref 8.4–10.5)
Chloride: 100 mEq/L (ref 96–112)
Creatinine, Ser: 0.57 mg/dL (ref 0.50–1.10)
GFR calc Af Amer: >90 mL/min (ref >90)
GFR calc non Af Amer: >90 mL/min (ref >90)
Glucose, Bld: 63 mg/dL — ABNORMAL LOW (ref 70–99)
Potassium: 4.5 mEq/L (ref 3.7–5.3)
Sodium: 138 mEq/L (ref 137–147)
Total Bilirubin: 0.2 mg/dL — ABNORMAL LOW (ref 0.3–1.2)
Total Protein: 7.8 g/dL (ref 6.0–8.3)

## 2014-07-20 LAB — URINALYSIS, ROUTINE W REFLEX MICROSCOPIC
Glucose, UA: NEGATIVE mg/dL
Ketones, ur: >80 mg/dL — AB
Nitrite: NEGATIVE
Protein, ur: 30 mg/dL — AB
Specific Gravity, Urine: 1.015 (ref 1.005–1.030)
Urobilinogen, UA: 0.2 mg/dL (ref 0.0–1.0)
pH: 6.5 (ref 5.0–8.0)

## 2014-07-20 MED ORDER — DEXTROSE 5 % IN LACTATED RINGERS IV BOLUS
1000.0000 mL | Freq: Once | INTRAVENOUS | Status: AC
  Administered 2014-07-20: 1000 mL via INTRAVENOUS

## 2014-07-20 MED ORDER — ONDANSETRON HCL 4 MG PO TABS: 4.0000 mg | ORAL_TABLET | Freq: Four times a day (QID) | ORAL | Status: DC

## 2014-07-20 MED ORDER — ONDANSETRON 8 MG PO TBDP
8.0000 mg | ORAL_TABLET | Freq: Once | ORAL | Status: AC
  Administered 2014-07-20: 8 mg via ORAL
  Filled 2014-07-20: qty 1

## 2014-07-20 NOTE — Discharge Instructions (Signed)
Dehydration, Adult Dehydration is when you lose more fluids from the body than you take in. Vital organs like the kidneys, brain, and heart cannot function without a proper amount of fluids and salt. Any loss of fluids from the body can cause dehydration.  CAUSES   Vomiting.  Diarrhea.  Excessive sweating.  Excessive urine output.  Fever. SYMPTOMS  Mild dehydration  Thirst.  Dry lips.  Slightly dry mouth. Moderate dehydration  Very dry mouth.  Sunken eyes.  Skin does not bounce back quickly when lightly pinched and released.  Dark urine and decreased urine production.  Decreased tear production.  Headache. Severe dehydration  Very dry mouth.  Extreme thirst.  Rapid, weak pulse (more than 100 beats per minute at rest).  Cold hands and feet.  Not able to sweat in spite of heat and temperature.  Rapid breathing.  Blue lips.  Confusion and lethargy.  Difficulty being awakened.  Minimal urine production.  No tears. DIAGNOSIS  Your caregiver will diagnose dehydration based on your symptoms and your exam. Blood and urine tests will help confirm the diagnosis. The diagnostic evaluation should also identify the cause of dehydration. TREATMENT  Treatment of mild or moderate dehydration can often be done at home by increasing the amount of fluids that you drink. It is best to drink small amounts of fluid more often. Drinking too much at one time can make vomiting worse. Refer to the home care instructions below. Severe dehydration needs to be treated at the hospital where you will probably be given intravenous (IV) fluids that contain water and electrolytes. HOME CARE INSTRUCTIONS   Ask your caregiver about specific rehydration instructions.  Drink enough fluids to keep your urine clear or pale yellow.  Drink small amounts frequently if you have nausea and vomiting.  Eat as you normally do.  Avoid:  Foods or drinks high in sugar.  Carbonated  drinks.  Juice.  Extremely hot or cold fluids.  Drinks with caffeine.  Fatty, greasy foods.  Alcohol.  Tobacco.  Overeating.  Gelatin desserts.  Wash your hands well to avoid spreading bacteria and viruses.  Only take over-the-counter or prescription medicines for pain, discomfort, or fever as directed by your caregiver.  Ask your caregiver if you should continue all prescribed and over-the-counter medicines.  Keep all follow-up appointments with your caregiver. SEEK MEDICAL CARE IF:  You have abdominal pain and it increases or stays in one area (localizes).  You have a rash, stiff neck, or severe headache.  You are irritable, sleepy, or difficult to awaken.  You are weak, dizzy, or extremely thirsty. SEEK IMMEDIATE MEDICAL CARE IF:   You are unable to keep fluids down or you get worse despite treatment.  You have frequent episodes of vomiting or diarrhea.  You have blood or green matter (bile) in your vomit.  You have blood in your stool or your stool looks black and tarry.  You have not urinated in 6 to 8 hours, or you have only urinated a small amount of very dark urine.  You have a fever.  You faint. MAKE SURE YOU:   Understand these instructions.  Will watch your condition.  Will get help right away if you are not doing well or get worse. Document Released: 09/29/2005 Document Revised: 12/22/2011 Document Reviewed: 05/19/2011 ExitCare Patient Information 2015 [employer]. This information is not intended to replace advice given to you by your health care provider. Make sure you discuss any questions you have with your health care   provider.  

## 2014-07-20 NOTE — MAU Note (Signed)
Pt states she has been having nausea vomiting and diarrhea for about 1 wk. Pt state all her symptoms began at the same time. [facility] bag of fluids. Pt was also went home with Zofran, pt states she took it 1 time at home and it did not help. Pt diarrhea and cramping is what is giving her the most discomfort

## 2014-07-20 NOTE — MAU Provider Note (Signed)
History     CSN: [id number]  Arrival date and time: 07/20/14 1426   First Provider Initiated Contact with Patient 07/20/14 1442      Chief Complaint  Patient presents with  . Emesis  . Diarrhea  . Abdominal Pain   HPIpt is G1P1001 s/p C/S 06/26/2014.  Pt states she did well after the C-Section and had no problems. Pt presents today with nausea and vomiting and diarrhea for one week.  Pt states she has not  Been around any other people who have been sick.  Pt is breast feeding with supplementing formula.  Pt tried to eat soup  This afternoon and it went right through her.  Pt was seen on 07/18/2014 at [facility] ED and given IVF. Pt was also given a prescription for Zofran 4mg  tablet but did not help and did not get prescription filled. Pt denies any surgical pain or bleeding.  Incision is well healed. Pt Rn note Garvin Filaonna C Coley, RN Registered Nurse Signed Nursing MAU Note Service date: 07/20/2014 3:07 PM   Patient states she has had nausea, vomiting and diarrhea for about one week. Was seen and given IVF. States no vomiting today but continues to have diarrhea. Feels dehydrated. Delivered by C/S on 06-26-14    Signed Other MAU Note Service date: 07/20/2014 3:26 PM   Pt states she has been having nausea vomiting and diarrhea for about 1 wk. Pt state all her symptoms began at the same time. [facility] and given 1 bag of fluids. Pt was also went home with Zofran, pt states she took it 1 time at home and it did not help. Pt diarrhea and cramping is what is giving her the most discomfort     Past Medical History  Diagnosis Date  . Abnormal Pap smear 03/29/2013    ASC-US +HPV  . Infection     UTI  . Vaginal Pap smear, abnormal     Past Surgical History  Procedure Laterality Date  . No past surgeries    . Cesarean section N/A 06/26/2014    Procedure: CESAREAN SECTION;  Surgeon: Marlow Baarsyanna Clark, MD;  Location: [facility] ORS;  Service: Obstetrics;  Laterality: N/A;    Family History  Problem  Relation Age of Onset  . Cancer Father     colon  . Cancer Paternal Aunt     colon  . Cancer Paternal Uncle     colon    History  Substance Use Topics  . Smoking status: Former Games developermoker  . Smokeless tobacco: Never Used     Comment: prior to preg  . Alcohol Use: No    Allergies: No Known Allergies  Prescriptions prior to admission  Medication Sig Dispense Refill  . bismuth subsalicylate (PEPTO BISMOL) 262 MG/15ML suspension Take 30 mLs by mouth every 6 (six) hours as needed for diarrhea or loose stools.      . ferrous sulfate 325 (65 FE) MG tablet Take 325 mg by mouth daily with breakfast.      . Prenatal Vit-Min-FA-Fish Oil ([employer] PRENATAL GUMMY PO) Take 2 each by mouth daily.       . ondansetron (ZOFRAN) 4 MG tablet Take 1 tablet (4 mg total) by mouth every 8 (eight) hours as needed for nausea or vomiting.  12 tablet  0    Review of Systems  Constitutional: Negative for fever and chills.  Gastrointestinal: Positive for nausea, vomiting, abdominal pain and diarrhea.  Genitourinary: Negative for dysuria.   Physical Exam  Blood pressure 99/62, pulse 102, temperature 99 F (37.2 C), temperature source Oral, resp. rate 16, SpO2 98.00%, unknown if currently breastfeeding.  Physical Exam  Constitutional: She is oriented to person, place, and time. She appears well-developed and well-nourished.  Dry lips  HENT:  Head: Normocephalic.  Eyes: Pupils are equal, round, and reactive to light.  Neck: Normal range of motion. Neck supple.  Cardiovascular: Normal rate.   Respiratory: Effort normal.  GI: Soft. She exhibits no distension. There is no tenderness. There is no rebound.  Low transverse incision clean and dry  Musculoskeletal: Normal range of motion.  Neurological: She is alert and oriented to person, place, and time.  Skin: Skin is warm and dry. She is not diaphoretic.  Psychiatric: She has a normal mood and affect.    MAU Course  Procedures Results for orders placed  during the hospital encounter of 07/20/14 (from the past 24 hour(s))  URINALYSIS, ROUTINE W REFLEX MICROSCOPIC     Status: Abnormal   Collection Time    07/20/14  3:15 PM      Result Value Ref Range   Color, Urine YELLOW  YELLOW   APPearance CLEAR  CLEAR   Specific Gravity, Urine 1.015  1.005 - 1.030   pH 6.5  5.0 - 8.0   Glucose, UA NEGATIVE  NEGATIVE mg/dL   Hgb urine dipstick LARGE (*) NEGATIVE   Bilirubin Urine MODERATE (*) NEGATIVE   Ketones, ur >80 (*) NEGATIVE mg/dL   Protein, ur 30 (*) NEGATIVE mg/dL   Urobilinogen, UA 0.2  0.0 - 1.0 mg/dL   Nitrite NEGATIVE  NEGATIVE   Leukocytes, UA MODERATE (*) NEGATIVE  URINE MICROSCOPIC-ADD ON     Status: Abnormal   Collection Time    07/20/14  3:15 PM      Result Value Ref Range   Squamous Epithelial / LPF FEW (*) RARE   WBC, UA 11-20  <3 WBC/hpf   RBC / HPF 3-6  <3 RBC/hpf   Bacteria, UA MANY (*) RARE   Urine-Other MUCOUS PRESENT    CBC     Status: Abnormal   Collection Time    07/20/14  4:40 PM      Result Value Ref Range   WBC 9.3  4.0 - 10.5 K/uL   RBC 5.15 (*) 3.87 - 5.11 MIL/uL   Hemoglobin 12.9  12.0 - 15.0 g/dL   HCT 16.1  09.6 - 04.5 %   MCV 77.7 (*) 78.0 - 100.0 fL   MCH 25.0 (*) 26.0 - 34.0 pg   MCHC 32.3  30.0 - 36.0 g/dL   RDW 40.9 (*) 81.1 - 91.4 %   Platelets 332  150 - 400 K/uL  COMPREHENSIVE METABOLIC PANEL     Status: Abnormal   Collection Time    07/20/14  4:40 PM      Result Value Ref Range   Sodium 138  137 - 147 mEq/L   Potassium 4.5  3.7 - 5.3 mEq/L   Chloride 100  96 - 112 mEq/L   CO2 23  19 - 32 mEq/L   Glucose, Bld 63 (*) 70 - 99 mg/dL   BUN 4 (*) 6 - 23 mg/dL   Creatinine, Ser 7.82  0.50 - 1.10 mg/dL   Calcium 9.1  8.4 - 95.6 mg/dL   Total Protein 7.8  6.0 - 8.3 g/dL   Albumin 3.8  3.5 - 5.2 g/dL   AST 23  0 - 37 U/L   ALT 10  0 -  35 U/L   Alkaline Phosphatase 76  39 - 117 U/L   Total Bilirubin 0.2 (*) 0.3 - 1.2 mg/dL   GFR calc non Af Amer >90  >90 mL/min   GFR calc Af Amer >90  >90  mL/min   Anion gap 15  5 - 15  urine culture pending IVF 1 liter D5LR and zofran 8 mg ODT given- pt got relief of sx and was able to tolerate crackers Pt did not have any diarrhea or vomiting while in MAU Assessment and Plan  Gastroenteritis Rx Zofran 8 mg ODT Diet for diarrhea Dehydration- importance of fluid intake F/u with Post op appointment-  LINEBERRY,SUSAN 07/20/2014, 3:58 PM

## 2014-07-20 NOTE — MAU Note (Signed)
Patient states she has had nausea, vomiting and diarrhea for about one week. Was seen and given IVF. States no vomiting today but continues to have diarrhea. Feels dehydrated. Delivered by C/S on 06-26-14

## 2014-07-22 LAB — URINE CULTURE
Colony Count: NO GROWTH
Culture: NO GROWTH

## 2014-07-24 ENCOUNTER — Emergency Department (HOSPITAL_COMMUNITY): Payer: 59

## 2014-07-24 ENCOUNTER — Emergency Department (HOSPITAL_COMMUNITY)
Admission: EM | Admit: 2014-07-24 | Discharge: 2014-07-25 | Disposition: A | Discharge status: Home / Self Care | Payer: 59 | Attending: Emergency Medicine | Admitting: Emergency Medicine

## 2014-07-24 ENCOUNTER — Encounter (HOSPITAL_COMMUNITY): Payer: Self-pay | Admitting: Emergency Medicine

## 2014-07-24 DIAGNOSIS — Z87891 Personal history of nicotine dependence: Secondary | ICD-10-CM | POA: Insufficient documentation

## 2014-07-24 DIAGNOSIS — K529 Noninfective gastroenteritis and colitis, unspecified: Secondary | ICD-10-CM | POA: Diagnosis not present

## 2014-07-24 DIAGNOSIS — Z87440 Personal history of urinary (tract) infections: Secondary | ICD-10-CM | POA: Insufficient documentation

## 2014-07-24 DIAGNOSIS — Z3202 Encounter for pregnancy test, result negative: Secondary | ICD-10-CM | POA: Insufficient documentation

## 2014-07-24 DIAGNOSIS — R509 Fever, unspecified: Secondary | ICD-10-CM | POA: Insufficient documentation

## 2014-07-24 DIAGNOSIS — R110 Nausea: Secondary | ICD-10-CM | POA: Diagnosis present

## 2014-07-24 DIAGNOSIS — Z79899 Other long term (current) drug therapy: Secondary | ICD-10-CM | POA: Insufficient documentation

## 2014-07-24 LAB — CBC WITH DIFFERENTIAL/PLATELET
Basophils Absolute: 0 10*3/uL (ref 0.0–0.1)
Basophils Relative: 0 % (ref 0–1)
Eosinophils Absolute: 0.1 10*3/uL (ref 0.0–0.7)
Eosinophils Relative: 1 % (ref 0–5)
HCT: 44.7 % (ref 36.0–46.0)
Hemoglobin: 14.6 g/dL (ref 12.0–15.0)
Lymphocytes Relative: 24 % (ref 12–46)
Lymphs Abs: 3 10*3/uL (ref 0.7–4.0)
MCH: 25 pg — ABNORMAL LOW (ref 26.0–34.0)
MCHC: 32.7 g/dL (ref 30.0–36.0)
MCV: 76.7 fL — ABNORMAL LOW (ref 78.0–100.0)
Monocytes Absolute: 0.7 10*3/uL (ref 0.1–1.0)
Monocytes Relative: 6 % (ref 3–12)
Neutro Abs: 8.7 10*3/uL — ABNORMAL HIGH (ref 1.7–7.7)
Neutrophils Relative %: 69 % (ref 43–77)
Platelets: 345 10*3/uL (ref 150–400)
RBC: 5.83 MIL/uL — ABNORMAL HIGH (ref 3.87–5.11)
RDW: 16.6 % — ABNORMAL HIGH (ref 11.5–15.5)
WBC: 12.6 10*3/uL — ABNORMAL HIGH (ref 4.0–10.5)

## 2014-07-24 MED ORDER — IOHEXOL 300 MG/ML  SOLN: 50.0000 mL | Freq: Once | INTRAMUSCULAR | Status: AC | PRN

## 2014-07-24 MED ORDER — SODIUM CHLORIDE 0.9 % IV BOLUS (SEPSIS)
2000.0000 mL | Freq: Once | INTRAVENOUS | Status: AC
  Administered 2014-07-24: 2000 mL via INTRAVENOUS

## 2014-07-24 NOTE — ED Provider Notes (Signed)
CSN: [id number]     Arrival date & time 07/24/14  1809 History   First MD Initiated Contact with Patient 07/24/14 2259     Chief Complaint  Patient presents with  . Fever  . Chills  . Nausea     (Consider location/radiation/quality/duration/timing/severity/associated sxs/prior Treatment) HPI Matthew SarasKrizia S Steffey is a 28 y.o. female with no significant past medical history coming in with multiple complaints. She states these have occurred for 2 weeks and she has been to multiple emergency departments for evaluation. Her symptoms include chills, vomiting and diarrhea. She states she vomited about 4 times per day and has more than 5 episodes of diarrhea per day she denies any blood in either. She has no sick contacts and no one around her with similar symptoms. She denies any abdominal pain. She states she has diffuse body aches which is taking Motrin for. Patient presents this evening for repeat evaluation. She has no chest pain or shortness of breath. She denies productive cough or urinary symptoms. She has no further complaints.  10 Systems reviewed and are negative for acute change except as noted in the HPI.     Past Medical History  Diagnosis Date  . Abnormal Pap smear 03/29/2013    ASC-US +HPV  . Infection     UTI  . Vaginal Pap smear, abnormal    Past Surgical History  Procedure Laterality Date  . No past surgeries    . Cesarean section N/A 06/26/2014    Procedure: CESAREAN SECTION;  Surgeon: Marlow Baarsyanna Clark, MD;  Location: [facility];  Service: Obstetrics;  Laterality: N/A;   Family History  Problem Relation Age of Onset  . Cancer Father     colon  . Cancer Paternal Aunt     colon  . Cancer Paternal Uncle     colon   History  Substance Use Topics  . Smoking status: Former Games developermoker  . Smokeless tobacco: Never Used     Comment: prior to preg  . Alcohol Use: No   OB History   Grav Para Term Preterm Abortions TAB SAB Ect Mult Living   1 1 1       1      Review of  Systems    Allergies  Review of patient's allergies indicates no known allergies.  Home Medications   Prior to Admission medications   Medication Sig Start Date End Date Taking? Authorizing Provider  ferrous sulfate 325 (65 FE) MG tablet Take 325 mg by mouth daily with breakfast.   Yes Historical Provider, MD  ondansetron (ZOFRAN) 4 MG tablet Take 1 tablet (4 mg total) by mouth every 6 (six) hours. 07/20/14  Yes Jean RosenthalSusan P Lineberry, NP  Prenatal Vit-Min-FA-Fish Oil ([employer] PRENATAL GUMMY PO) Take 2 each by mouth daily.    Yes Historical Provider, MD   BP 101/84  Pulse 114  Temp(Src) 97.8 F (36.6 C) (Oral)  Resp 17  SpO2 96% Physical Exam  Nursing note and vitals reviewed. Constitutional: She is oriented to person, place, and time. She appears well-developed and well-nourished. No distress.  HENT:  Head: Normocephalic and atraumatic.  Nose: Nose normal.  Mouth/Throat: No oropharyngeal exudate.  Borderline dry oropharynx.  Eyes: Conjunctivae and EOM are normal. Pupils are equal, round, and reactive to light. No scleral icterus.  Neck: Normal range of motion. Neck supple. No JVD present. No tracheal deviation present. No thyromegaly present.  Cardiovascular: Normal rate, regular rhythm and normal heart sounds.  Exam reveals no gallop and no friction  rub.   No murmur heard. Pulmonary/Chest: Effort normal and breath sounds normal. No respiratory distress. She has no wheezes. She exhibits no tenderness.  Abdominal: Soft. Bowel sounds are normal. She exhibits no distension and no mass. There is tenderness. There is no rebound and no guarding.  Left lower quadrant tenderness to palpation  Musculoskeletal: Normal range of motion. She exhibits no edema and no tenderness.  Lymphadenopathy:    She has no cervical adenopathy.  Neurological: She is alert and oriented to person, place, and time. No cranial nerve deficit. She exhibits normal muscle tone.  Skin: Skin is warm and dry. No rash noted.  She is not diaphoretic. No erythema. No pallor.    ED Course  Procedures (including critical care time) Labs Review Labs Reviewed  CBC WITH DIFFERENTIAL - Abnormal; Notable for the following:    WBC 12.6 (*)    RBC 5.83 (*)    MCV 76.7 (*)    MCH 25.0 (*)    RDW 16.6 (*)    Neutro Abs 8.7 (*)    All other components within normal limits  COMPREHENSIVE METABOLIC PANEL - Abnormal; Notable for the following:    Potassium 3.1 (*)    BUN 5 (*)    Anion gap 23 (*)    All other components within normal limits  LIPASE, BLOOD  URINALYSIS, ROUTINE W REFLEX MICROSCOPIC  PREGNANCY, URINE  I-STAT CG4 LACTIC ACID, ED    Imaging Review Ct Abdomen Pelvis W Contrast  07/25/2014   CLINICAL DATA:  Lower abdominal pain. Patient had c-section approximately 1 month ago. Now with chills, fever and nausea. Initial encounter.  EXAM: CT ABDOMEN AND PELVIS WITH CONTRAST  TECHNIQUE: Multidetector CT imaging of the abdomen and pelvis was performed using the standard protocol following bolus administration of intravenous contrast.  CONTRAST:  100mL OMNIPAQUE IOHEXOL 300 MG/ML  SOLN  COMPARISON:  None  FINDINGS: There is diffuse wall thickening involving the majority of the colon, particularly, involving the ascending (coronal image 21, series 602) and transverse colon (image 36, series 2), not resulting in enteric obstruction. Enteric contrast extends to the level of the mid small bowel. No pneumoperitoneum, pneumatosis or portal venous gas. Note is made of a punctate (approximately 5 mm) appendicolith within otherwise normal-appearing appendix.  The uterus remains enlarged and boggy in appearance compatible with provided history of postpartum state. There is a minimal amount of presumably physiologic fluid within the endometrial canal. No discrete adnexal lesions. Is a trace amount of physiologic fluid within the pelvic cul-de-sac. Normal appearance of the urinary bladder given degree distention.  Normal hepatic  contour. Focal fatty infiltration adjacent to the fissure for ligamentum teres. No discrete hepatic lesions. Normal appearance of the gallbladder given degree distention. No radiopaque gallstones. No intra extrahepatic biliary duct dilatation. No ascites.  There is symmetric enhancement of the bilateral kidneys. No renal stones. No discrete renal lesions. No urinary obstruction a perinephric stranding.  Normal appearance of the bilateral adrenal glands, pancreas and spleen. Incidental note is made of a small splenule.  Normal caliber the abdominal aorta. The major branch vessels of the abdominal aorta appear widely patent on this non CTA examination.  Scattered shotty retroperitoneal and mesenteric lymph nodes individually not enlarged by size criteria with index left-sided periaortic lymph node measuring 0.5 cm in greatest short axis diameter and index mesenteric lymph node within the right lower abdominal quadrant measuring approximately 0.9 cm (image 42, series 2).  No retroperitoneal, mesenteric, pelvic or inguinal lymphadenopathy.  Limited visualization the lower thorax is negative for focal airspace opacity or pleural effusion.  Normal heart size.  No pericardial effusion.  No acute or aggressive osseus abnormalities. Regional soft tissues appear normal.  IMPRESSION: 1. Diffuse wall thickening involving the majority of the colon, most conspicuously involving the ascending and transverse colon, not resulting in enteric obstruction. Findings are nonspecific though likely of infectious or inflammatory etiology. 2. Note is made of a punctate (approximately 5 mm) appendicolith within otherwise normal-appearing appendix. 3. The uterus remains enlarged compatible with patient's recent postpartum state. There is a minimal amount of presumably physiologic fluid within the endometrial canal. No discrete adnexal lesions. Trace amount of physiologic fluid within the pelvic cul-de-sac.   Electronically Signed   By: Simonne Come M.D.   On: 07/25/2014 01:11     EKG Interpretation None      MDM   Final diagnoses:  None    Patient does emergency department for diffuse body aches chills vomiting diarrhea. This is likely a viral etiology however she is a bounce back patient. She does have left lower quadrant tenderness to palpation on exam, possible colitis. Will evaluate with CT scan. Patient was given 2 Liters of IV fluids for rehydration.  CT scan as above reveals inflammation and diffuse wall thickening of the descending and transverse colon. Patient was given Augmentin in the emergency department because she is breast-feeding can take ciprofloxacin. Patient is advised to followup with GI as this could be indicative of Crohn's disease versus ulcerative colitis. She denies any family history of this. Her vital signs remain within her normal limits and she is safe for discharge.  Tachycardia resolved after IV fluids.    Tomasita Crumble, MD 07/25/14 [phone]

## 2014-07-24 NOTE — ED Notes (Signed)
Pt states she has been coming to the hospital for the past 2 weeks. C/o chills, nausea and fever, wants to be tested flu.

## 2014-07-25 ENCOUNTER — Encounter (HOSPITAL_COMMUNITY): Payer: Self-pay

## 2014-07-25 DIAGNOSIS — K529 Noninfective gastroenteritis and colitis, unspecified: Secondary | ICD-10-CM | POA: Diagnosis not present

## 2014-07-25 LAB — COMPREHENSIVE METABOLIC PANEL
ALT: 9 U/L (ref 0–35)
AST: 14 U/L (ref 0–37)
Albumin: 4 g/dL (ref 3.5–5.2)
Alkaline Phosphatase: 81 U/L (ref 39–117)
Anion gap: 23 — ABNORMAL HIGH (ref 5–15)
BUN: 5 mg/dL — ABNORMAL LOW (ref 6–23)
CO2: 19 mEq/L (ref 19–32)
Calcium: 9.4 mg/dL (ref 8.4–10.5)
Chloride: 99 mEq/L (ref 96–112)
Creatinine, Ser: 0.63 mg/dL (ref 0.50–1.10)
GFR calc Af Amer: >90 mL/min (ref >90)
GFR calc non Af Amer: >90 mL/min (ref >90)
Glucose, Bld: 74 mg/dL (ref 70–99)
Potassium: 3.1 mEq/L — ABNORMAL LOW (ref 3.7–5.3)
Sodium: 141 mEq/L (ref 137–147)
Total Bilirubin: 0.3 mg/dL (ref 0.3–1.2)
Total Protein: 8.1 g/dL (ref 6.0–8.3)

## 2014-07-25 LAB — LIPASE, BLOOD: Lipase: 12 U/L (ref 11–59)

## 2014-07-25 LAB — PREGNANCY, URINE: Preg Test, Ur: NEGATIVE

## 2014-07-25 LAB — URINALYSIS, ROUTINE W REFLEX MICROSCOPIC
Bilirubin Urine: NEGATIVE
Glucose, UA: NEGATIVE mg/dL
Ketones, ur: >80 mg/dL — AB
Leukocytes, UA: NEGATIVE
Nitrite: NEGATIVE
Protein, ur: NEGATIVE mg/dL
Specific Gravity, Urine: 1.037 — ABNORMAL HIGH (ref 1.005–1.030)
Urobilinogen, UA: 0.2 mg/dL (ref 0.0–1.0)
pH: 6.5 (ref 5.0–8.0)

## 2014-07-25 LAB — URINE MICROSCOPIC-ADD ON

## 2014-07-25 LAB — I-STAT CG4 LACTIC ACID, ED: Lactic Acid, Venous: 0.95 mmol/L (ref 0.5–2.2)

## 2014-07-25 MED ORDER — AMOXICILLIN-POT CLAVULANATE 875-125 MG PO TABS
1.0000 | ORAL_TABLET | Freq: Once | ORAL | Status: AC
Start: 2014-07-25 — End: 2014-07-25
  Administered 2014-07-25: 1 via ORAL
  Filled 2014-07-25: qty 1

## 2014-07-25 MED ORDER — IOHEXOL 300 MG/ML  SOLN
100.0000 mL | Freq: Once | INTRAMUSCULAR | Status: AC | PRN
  Administered 2014-07-25: 100 mL via INTRAVENOUS

## 2014-07-25 MED ORDER — MAGNESIUM SULFATE 40 MG/ML IJ SOLN
2.0000 g | Freq: Once | INTRAMUSCULAR | Status: AC
  Administered 2014-07-25: 2 g via INTRAVENOUS
  Filled 2014-07-25: qty 50

## 2014-07-25 MED ORDER — POTASSIUM CHLORIDE CRYS ER 20 MEQ PO TBCR
40.0000 meq | EXTENDED_RELEASE_TABLET | Freq: Once | ORAL | Status: AC
  Administered 2014-07-25: 40 meq via ORAL
  Filled 2014-07-25: qty 2

## 2014-07-25 MED ORDER — AMOXICILLIN-POT CLAVULANATE 875-125 MG PO TABS: 1.0000 | ORAL_TABLET | Freq: Two times a day (BID) | ORAL | Status: DC

## 2014-07-25 NOTE — ED Notes (Signed)
Patient transported to CT 

## 2014-07-25 NOTE — Discharge Instructions (Signed)
Colitis Susan Solomon, he was seen today for abdominal pain. CT scan results are below. Followup with GI doctors for further evaluation of your colitis. Continue to take antibiotics as prescribed (the antibiotics given is safe in breastfeeding women), return to emergency department for any worsening. Thank you. IMPRESSION: 1. Diffuse wall thickening involving the majority of the colon, most conspicuously involving the ascending and transverse colon, not resulting in enteric obstruction. Findings are nonspecific though likely of infectious or inflammatory etiology. 2. Note is made of a punctate (approximately 5 mm) appendicolith within otherwise normal-appearing appendix. 3. The uterus remains enlarged compatible with patient's recent postpartum state. There is a minimal amount of presumably physiologic fluid within the endometrial canal. No discrete adnexal lesions. Trace amount of physiologic fluid within the pelvic cul-de-sac.  Colitis is inflammation of the colon. Colitis can be a short-term or long-standing (chronic) illness. Crohn's disease and ulcerative colitis are 2 types of colitis which are chronic. They usually require lifelong treatment. CAUSES  There are many different causes of colitis, including:  Viruses.  Germs (bacteria).  Medicine reactions. SYMPTOMS   Diarrhea.  Intestinal bleeding.  Pain.  Fever.  Throwing up (vomiting).  Tiredness (fatigue).  Weight loss.  Bowel blockage. DIAGNOSIS  The diagnosis of colitis is based on examination and stool or blood tests. X-rays, CT scan, and colonoscopy may also be needed. TREATMENT  Treatment may include:  Fluids given through the vein (intravenously).  Bowel rest (nothing to eat or drink for a period of time).  Medicine for pain and diarrhea.  Medicines (antibiotics) that kill germs.  Cortisone medicines.  Surgery. HOME CARE INSTRUCTIONS   Get plenty of rest.  Drink enough water and fluids to keep  your urine clear or pale yellow.  Eat a well-balanced diet.  Call your caregiver for follow-up as recommended. SEEK IMMEDIATE MEDICAL CARE IF:   You develop chills.  You have an oral temperature above 102 F (38.9 C), not controlled by medicine.  You have extreme weakness, fainting, or dehydration.  You have repeated vomiting.  You develop severe belly (abdominal) pain or are passing bloody or tarry stools. MAKE SURE YOU:   Understand these instructions.  Will watch your condition.  Will get help right away if you are not doing well or get worse. Document Released: 11/06/2004 Document Revised: 12/22/2011 Document Reviewed: 02/01/2010 [facility] Patient Information 2015 [place], [place]. This information is not intended to replace advice given to you by your health care provider. Make sure you discuss any questions you have with your health care provider.  Emergency Department Resource Guide 1) Find a Doctor and Pay Out of Pocket Although you won't have to find out who is covered by your insurance plan, it is a good idea to ask around and get recommendations. You will then need to call the office and see if the doctor you have chosen will accept you as a new patient and what types of options they offer for patients who are self-pay. Some doctors offer discounts or will set up payment plans for their patients who do not have insurance, but you will need to ask so you aren't surprised when you get to your appointment.  2) Contact Your Local Health Department Not all health departments have doctors that can see patients for sick visits, but many do, so it is worth a call to see if yours does. If you don't know where your local health department is, you can check in your phone book. The CDC also has  a tool to help you locate your state's health department, and many state websites also have listings of all of their local health departments.  3) Find a Walk-in Clinic If your illness is not  likely to be very severe or complicated, you may want to try a walk in clinic. These are popping up all over the country in pharmacies, drugstores, and shopping centers. They're usually staffed by nurse practitioners or physician assistants that have been trained to treat common illnesses and complaints. They're usually fairly quick and inexpensive. However, if you have serious medical issues or chronic medical problems, these are probably not your best option.  No Primary Care Doctor: - Call Health Connect at  [phone] - they can help you locate a primary care doctor that  accepts your insurance, provides certain services, etc. - Physician Referral Service- [phone]  Chronic Pain Problems: Organization         Address  Phone   Notes  [facility]  [phone] Patients need to be referred by their primary care doctor.   Medication Assistance: Organization         Address  Phone   Notes  [facility] Medication [facility] [address] [place], [place] [address] [phone] --Must be a resident of [facility] -- Must have NO insurance coverage whatsoever (no Medicaid/ Medicare, etc.) -- The pt. MUST have a primary care doctor that directs their care regularly and follows them in the community   [facility]  [phone]   [employer]  [phone]    Agencies that provide inexpensive medical care: Organization         Address  Phone   Notes  [facility]  [phone]   [facility]    [phone]   [facility] [address] [place], [place] [address] [phone]   [facility] [address] [place], [place] [phone]   Planned Parenthood    [phone]   [facility]    [phone]   Community [facility] and [facility]  [address], [place] Phone:  [phone], Fax:  [phone] Hours of Operation:  9 am - 6 pm,  M-F.  Also accepts Medicaid/Medicare and self-pay.  [facility]  [address], [place] Phone: [phone], Fax: [phone]. Hours of Operation:  8:30 am - 5:30 pm, M-F.  Also accepts Medicaid and self-pay.  [facility] [place] [address] [place] Phone: [phone]   [facility] [address] Natasha Bence [place], [place] [phone], Ext. [phone] Mondays & Thursdays: 7-9 AM.  First 15 patients are seen on a first come, first serve basis.    Medicaid-accepting [facility] Providers:  Organization         Address  Phone   Notes  [facility] [address], [place] [phone] Also accepts self-pay patients.  [facility] [address] Laurell Josephs [place], [place]  [phone]   [facility] [address], [place] [phone]   [facility] [address], [place] [phone]   Renaye Rakers [address], [place]   [phone] Only accepts [place] patients after they have their name applied to their card.   Self-Pay (no insurance) in [place]:  Organization         Address  Phone   Notes  Sickle Cell Patients, [facility] [address], [place] [phone]   [facility] [address], [place] [phone]) [phone]   [facility]  [address], [place] [phone]) [phone]   [facility]/Dr. Osei-Bonsu  [address] [place] [address], [place] or [phone], [place] [phone]) [phone] Phone number for both [place] and [place] locations is the same.  [facility] [address], [place] [phone]) [phone]   [facility] [place] [address] [place] [address], [place] or [address] [phone]   [facility] [address], [place] [phone]) [phone], phone; [phone], fax Sees patients 1st and 3rd Saturday of every month.  Must not qualify for public or private insurance (i.e. Medicaid, Medicare, [place] Health Choice, Veterans' Benefits)  Household income should be no more than 200% of the poverty level The clinic cannot treat you if you are pregnant or think you are pregnant  Sexually transmitted diseases are not treated at the clinic.    Dental Care: Organization         Address  Phone  Notes  [facility] [address], [place] [phone]) [phone] Accepts children up to age 621 who are enrolled in [place]icaid or [place] Health Choice; pregnant women with a Medicaid card; and children who have applied for Medicaid or [place] Health Choice, but were declined, whose parents can pay a reduced fee at time of service.  [facility] [place]  [address], [place] [phone]) [phone] Accepts children up to age 26 who are enrolled in [place] or [place] Health Choice; pregnant women with a Medicaid card; and children who have applied for Medicaid or [place] Health Choice, but were declined, whose parents can pay a reduced fee at time of service.  [facility]  [address], [place] [phone] Patients are seen by appointment only. Walk-ins are not accepted. [facility] will see patients 26 years of age and older. Monday - Tuesday (8am-5pm) Most Wednesdays (8:30-5pm) $30 per visit, cash only  [facility]  [address], [facility] [phone] Patients are seen by appointment only. Walk-ins are not accepted. [facility] will see patients 26 years of age and older. One Wednesday Evening (Monthly: Volunteer Based).  $30 per visit, cash only  [facility]  [phone] for adults; Children under age 584, call Graduate Pediatric Dentistry at [phone])  [phone]. Children aged 674-14, please call [phone] to request a pediatric application.  Dental services are provided in all areas of dental care including fillings, crowns and bridges, complete and partial dentures, implants, gum treatment, root canals, and extractions. Preventive care is also provided. Treatment is provided to both adults and children. Patients are selected via a lottery and there is often a waiting list.   [facility] [address], [place]  [phone] [email or web address]   [facility] [address], [place], [place] [phone], Ext. [phone] Second and Fourth Thursday of each month, opens at 6:30 AM; Clinic ends at 9 AM.  Patients are seen on a first-come first-served basis, and a limited number are seen during each clinic.  [facility]  [address], [place], [place] [phone]) [phone]   Eligibility Requirements You must have lived in [place], [place], or [place] counties for at least the last three months.   You cannot be eligible for state or federal sponsored [place] Cityhealthcare insurance, including CIGNAVeterans Administration, [place]icaid, or [employer] EntertainmentMedicare.   You generally cannot be eligible for healthcare insurance through your employer.    How to apply: Eligibility screenings are held every Tuesday and Wednesday afternoon from 1:00 pm until 4:00 pm. You do not need an appointment for the interview!  [facility] [address], [place], [place] [phone]   [facility]  [phone]   [facility] [facility]  [phone]   [facility]  [phone]    Behavioral Health Resources in the Community: Intensive Outpatient Programs Organization         Address  Phone  Notes  [facility] [address], [place], [place] [phone]   [facility]patient [address], [place], [place] [phone]   [facility] Drug [facility]  [address], [place], [place]  [phone]   [facility] [address],  [place], [place] [phone] or [phone]   Substance Abuse Resources Organization         Address  Phone  Notes  [facility] Drug [facility]  [phone]   [facility]  [phone]   [facility]  [phone]   [facility]  [phone]   Residential & Outpatient Substance Abuse Program  [phone]   Psychological Services Organization         Address  Phone  Notes  [facility]  [phone]- [phone]   [facility]  [phone]- [phone]   [facility] [address], [place] [phone] or [phone]    Mobile Crisis Teams Organization         Address  Phone  Notes  [facility], Mobile Crisis Care Unit  [phone]   [facility]  [address] [place], [place] [phone]   Doristine LocksSharon DeEsch [address] [place] [phone]    Self-Help/Support Groups Organization         Address  Phone             Notes  [facility] [place] - variety of support groups  336- [phone] Call for more information  Narcotics Anonymous (NA), [facility] [address], [place]   2 meetings at this location   Statisticianesidential Treatment Programs Organization         Address  Phone  Notes  [facility] [address],    [place]  [phone]   [facility]  [address], [place] [phone], [place], [place] [phone]   [facility] [address], [place] [phone] Admissions: 8am-3pm M-F  [facility] [address]    [place], [place] [phone]   [facility] [address], [place], [place] [phone]   [facility] [address]  [place], [place] [phone]   [facility] - Intensive Outpatient [address], [place], [place]  [phone]   [facility] ([facility] [address]  [place], [place] [phone] or [phone]   [facility] ([facility]) [address] [place], [place] [phone] Accepts Medicaid  [facility] [address]  [place] [phone] Substance Abuse/Addiction  Treatment   [facility] Organization         Address  Phone  Notes  [facility]  [phone]   Angie Fava, PhD [address] Ervin Knack [place], [place]   [phone] or [phone]   [facility] Behavioral   [address] [place], [place] [phone]   [facility] [address] [place], [place] [phone] Insurance/Medicaid/sponsorship through [facility] and Families [address]                                    [place], [place] [phone] Therapy/tele-psych/case  [facility] [address], [place] [phone]    Dr. Lolly Mustache  [phone]   [facility] [place]  [employer] [facility] Dept. 1) [address] [place] 2) [address] [place] 3)  [address], [place] [phone]  [phone]   [facility] Child Abuse Hotline 917-801-7616 or [phone] (After Hours)

## 2014-08-14 ENCOUNTER — Encounter (HOSPITAL_COMMUNITY): Payer: Self-pay

## 2016-04-29 ENCOUNTER — Encounter (HOSPITAL_COMMUNITY): Payer: Self-pay | Admitting: *Deleted

## 2018-01-23 ENCOUNTER — Emergency Department (HOSPITAL_COMMUNITY)
Admission: EM | Admit: 2018-01-23 | Discharge: 2018-01-23 | Disposition: A | Discharge status: Home / Self Care | Payer: Managed Care, Other (non HMO) | Attending: Emergency Medicine | Admitting: Emergency Medicine

## 2018-01-23 ENCOUNTER — Emergency Department (HOSPITAL_COMMUNITY): Payer: Managed Care, Other (non HMO)

## 2018-01-23 ENCOUNTER — Encounter (HOSPITAL_COMMUNITY): Payer: Self-pay | Admitting: Emergency Medicine

## 2018-01-23 ENCOUNTER — Other Ambulatory Visit: Payer: Self-pay

## 2018-01-23 DIAGNOSIS — Y9389 Activity, other specified: Secondary | ICD-10-CM | POA: Insufficient documentation

## 2018-01-23 DIAGNOSIS — Z79899 Other long term (current) drug therapy: Secondary | ICD-10-CM | POA: Insufficient documentation

## 2018-01-23 DIAGNOSIS — M79604 Pain in right leg: Secondary | ICD-10-CM | POA: Diagnosis not present

## 2018-01-23 DIAGNOSIS — Z87891 Personal history of nicotine dependence: Secondary | ICD-10-CM | POA: Insufficient documentation

## 2018-01-23 DIAGNOSIS — M25562 Pain in left knee: Secondary | ICD-10-CM | POA: Insufficient documentation

## 2018-01-23 DIAGNOSIS — Y999 Unspecified external cause status: Secondary | ICD-10-CM | POA: Diagnosis not present

## 2018-01-23 DIAGNOSIS — Y92410 Unspecified street and highway as the place of occurrence of the external cause: Secondary | ICD-10-CM | POA: Diagnosis not present

## 2018-01-23 MED ORDER — IBUPROFEN 800 MG PO TABS
800.0000 mg | ORAL_TABLET | Freq: Once | ORAL | Status: AC
  Administered 2018-01-23: 800 mg via ORAL
  Filled 2018-01-23: qty 1

## 2018-01-23 MED ORDER — METHOCARBAMOL 500 MG PO TABS: 500.0000 mg | ORAL_TABLET | Freq: Every evening | ORAL | 0 refills | Status: DC | PRN

## 2018-01-23 MED ORDER — NAPROXEN 375 MG PO TABS: 375.0000 mg | ORAL_TABLET | Freq: Two times a day (BID) | ORAL | 0 refills | Status: DC
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
## 2018-08-02 ENCOUNTER — Encounter (HOSPITAL_COMMUNITY): Payer: Self-pay | Admitting: Emergency Medicine

## 2018-08-02 ENCOUNTER — Emergency Department (HOSPITAL_COMMUNITY)
Admission: EM | Admit: 2018-08-02 | Discharge: 2018-08-02 | Disposition: A | Discharge status: Home / Self Care | Payer: Managed Care, Other (non HMO) | Attending: Emergency Medicine | Admitting: Emergency Medicine

## 2018-08-02 DIAGNOSIS — Z87891 Personal history of nicotine dependence: Secondary | ICD-10-CM | POA: Diagnosis not present

## 2018-08-02 DIAGNOSIS — H029 Unspecified disorder of eyelid: Secondary | ICD-10-CM

## 2018-08-02 DIAGNOSIS — Z79899 Other long term (current) drug therapy: Secondary | ICD-10-CM | POA: Diagnosis not present

## 2018-08-02 DIAGNOSIS — H02844 Edema of left upper eyelid: Secondary | ICD-10-CM | POA: Diagnosis present

## 2018-08-02 MED ORDER — ERYTHROMYCIN 5 MG/GM OP OINT: TOPICAL_OINTMENT | OPHTHALMIC | 0 refills | Status: DC

## 2018-08-02 NOTE — ED Triage Notes (Signed)
Pt reports had swelling on left eye lid x weeks. Not painful but getting larger in size.

## 2018-08-02 NOTE — Discharge Instructions (Signed)
This is likely either a stye or chalazion please use antibiotic ointment as directed, I have also like free to use warm compresses several times a day.  If the area does not improve you will need to follow-up with ophthalmology for further evaluation.  Return to the emergency department if you have fevers, redness or swelling of the entire area surrounding your eye, pain in the eye itself or changes in vision or any other new or concerning symptoms.

## 2018-08-02 NOTE — ED Provider Notes (Signed)
[facility]-EMERGENCY DEPT Provider Note   CSN: [id number] Arrival date & time: 08/02/18  1710     History   Chief Complaint Chief Complaint  Patient presents with  . Facial Swelling    HPI Susan Solomon is a 32 y.o. female.  Susan Solomon is a 32 y.o. Female who is otherwise healthy, presents emergency department for evaluation of a bump noted over the left eyelid.  She reports this started about 2 weeks ago as what she thought was a stye and she used some over-the-counter ointment and it seemed to improve but never completely resolved.  She reports it is not as painful as it was originally but seems to be getting larger.  She denies any pain or an irritation over the eye itself, no visual changes.  No swelling or redness around the eye and no difficulty with eye movements.  Patient does wear glasses.  Has not tried anything else to treat her symptoms, denies any other aggravating or alleviating factors.     Past Medical History:  Diagnosis Date  . Abnormal Pap smear 03/29/2013   ASC-US +HPV  . Infection    UTI  . Vaginal Pap smear, abnormal     Patient Active Problem List   Diagnosis Date Noted  . Arrest of descent, delivered, current hospitalization 06/27/2014  . 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 ECZEMA, ATOPIC DERMATITIS 12/10/2006    Past Surgical History:  Procedure Laterality Date  . CESAREAN SECTION N/A 06/26/2014   Procedure: CESAREAN SECTION;  Surgeon: Marlow Baars, MD;  Location: [facility];  Service: Obstetrics;  Laterality: N/A;  . NO PAST SURGERIES       OB History    Gravida  1   Para  1   Term  1   Preterm      AB      Living  1     SAB      TAB      Ectopic      Multiple      Live Births  1            Home Medications    Prior to Admission medications   Medication Sig Start Date  End Date Taking? Authorizing Provider  amoxicillin-clavulanate (AUGMENTIN) 875-125 MG per tablet Take 1 tablet by mouth 2 (two) times daily. 07/25/14   Tomasita Crumble, MD  ferrous sulfate 325 (65 FE) MG tablet Take 325 mg by mouth daily with breakfast.    [provider]  methocarbamol (ROBAXIN) 500 MG tablet Take 1 tablet (500 mg total) by mouth at bedtime as needed. 01/23/18   Mathews Robinsons B, PA-C  naproxen (NAPROSYN) 375 MG tablet Take 1 tablet (375 mg total) by mouth 2 (two) times daily. 01/23/18   Mathews Robinsons B, PA-C  ondansetron (ZOFRAN) 4 MG tablet Take 1 tablet (4 mg total) by mouth every 6 (six) hours. 07/20/14   Jean Rosenthal, NP  Prenatal Vit-Min-FA-Fish Oil ([employer] PRENATAL GUMMY PO) Take 2 each by mouth daily.     [provider]    Family History Family History  Problem Relation Age of Onset  . Cancer Father        colon  . Cancer Paternal Aunt        colon  . Cancer Paternal Uncle        colon    Social History  Social History   Tobacco Use  . Smoking status: Former Games developer  . Smokeless tobacco: Never Used  . Tobacco comment: prior to preg  Substance Use Topics  . Alcohol use: No  . Drug use: No     Allergies   Patient has no known allergies.   Review of Systems Review of Systems  Constitutional: Negative for chills and fever.  HENT: Negative for facial swelling.   Eyes: Negative for photophobia, pain, discharge, redness, itching and visual disturbance.       Eyelid swelling  Skin: Negative for color change, rash and wound.     Physical Exam Updated Vital Signs BP (!) 106/55 (BP Location: Left Arm)   Pulse 70   Temp 98 F (36.7 C) (Oral)   Resp 18   LMP 07/25/2018   SpO2 98%   Physical Exam  Constitutional: She appears well-developed and well-nourished. No distress.  HENT:  Head: Normocephalic and atraumatic.  Eyes: Right eye exhibits no discharge. Left eye exhibits no discharge.  Area of swelling that is mildly  tender to palpation over the left upper eyelid as pictured below most consistent with a large stye or chalazion.  No induration of the eyelid or swelling or tenderness elsewhere to suggest periorbital cellulitis, no proptosis.  There is no scleral injection.  PERRLA, EOMI, no consensual pain Right eye and eyelids unremarkable.  Pulmonary/Chest: Effort normal. No respiratory distress.  Neurological: She is alert. Coordination normal.  Skin: Skin is warm and dry. She is not diaphoretic.  Psychiatric: She has a normal mood and affect. Her behavior is normal.  Nursing note and vitals reviewed.        ED Treatments / Results  Labs (all labs ordered are listed, but only abnormal results are displayed) Labs Reviewed - No data to display  EKG None  Radiology No results found.  Procedures Procedures (including critical care time)  Medications Ordered in ED Medications - No data to display   Initial Impression / Assessment and Plan / ED Course  I have reviewed the triage vital signs and the nursing notes.  Pertinent labs & imaging results that were available during my care of the patient were reviewed by me and considered in my medical decision making (see chart for details).  Physical exam with left upper lid erythema, tenderness and mild swelling without injection of the cornea or conjunctiva, consistent with external hordeolum versus chalazion.  No purulent discharge, corneal abrasions, entrapment, consensual photophobia. No induration of the eyelid to suggest periorbital cellulitis. Presentation non-concerning for iritis, bacterial conjunctivitis, corneal abrasions, or HSV.   Will give erythromycin ointment for the left eye and discussed use of warm compresses.  Patient advised to followup with ophthalmologist if symptoms persist or worsen in any way including vision change or purulent discharge.    It has been determined that no acute conditions requiring further emergency  intervention are present at this time. The patient/guardian have been advised of the diagnosis and plan. We have discussed signs and symptoms that warrant return to the ED, such as changes or worsening in symptoms.   Patient has voiced understanding and agreed to follow-up with the PCP or specialist.  Final Clinical Impressions(s) / ED Diagnoses   Final diagnoses:  Lesion of left eyelid    ED Discharge Orders         Ordered    erythromycin ophthalmic ointment     08/02/18 1735           Dartha Lodge,  PA-C 08/02/18 1749    Arby Barrette, MD 08/05/18 [phone]

## 2019-05-24 ENCOUNTER — Other Ambulatory Visit: Payer: Self-pay

## 2019-05-24 ENCOUNTER — Other Ambulatory Visit (HOSPITAL_COMMUNITY)
Admission: RE | Admit: 2019-05-24 | Discharge: 2019-05-24 | Disposition: A | Discharge status: Home / Self Care | Payer: 59 | Source: Ambulatory Visit | Attending: Family | Admitting: Family

## 2019-05-24 ENCOUNTER — Ambulatory Visit (INDEPENDENT_AMBULATORY_CARE_PROVIDER_SITE_OTHER): Payer: 59 | Admitting: Family

## 2019-05-24 ENCOUNTER — Other Ambulatory Visit (INDEPENDENT_AMBULATORY_CARE_PROVIDER_SITE_OTHER): Payer: 59

## 2019-05-24 ENCOUNTER — Other Ambulatory Visit: Payer: 59

## 2019-05-24 ENCOUNTER — Encounter: Payer: Self-pay | Admitting: Family

## 2019-05-24 VITALS — BP 108/68 | HR 70 | Temp 98.0°F | Ht 67.0 in | Wt 146.1 lb

## 2019-05-24 DIAGNOSIS — Z124 Encounter for screening for malignant neoplasm of cervix: Secondary | ICD-10-CM | POA: Insufficient documentation

## 2019-05-24 DIAGNOSIS — Z0000 Encounter for general adult medical examination without abnormal findings: Secondary | ICD-10-CM

## 2019-05-24 DIAGNOSIS — Z309 Encounter for contraceptive management, unspecified: Secondary | ICD-10-CM | POA: Diagnosis not present

## 2019-05-24 DIAGNOSIS — Z13220 Encounter for screening for lipoid disorders: Secondary | ICD-10-CM

## 2019-05-24 DIAGNOSIS — Z789 Other specified health status: Secondary | ICD-10-CM

## 2019-05-24 DIAGNOSIS — Z113 Encounter for screening for infections with a predominantly sexual mode of transmission: Secondary | ICD-10-CM | POA: Diagnosis not present

## 2019-05-24 LAB — COMPREHENSIVE METABOLIC PANEL
ALT: 8 U/L (ref 0–35)
AST: 11 U/L (ref 0–37)
Albumin: 4.5 g/dL (ref 3.5–5.2)
Alkaline Phosphatase: 46 U/L (ref 39–117)
BUN: 10 mg/dL (ref 6–23)
CO2: 26 mEq/L (ref 19–32)
Calcium: 9.3 mg/dL (ref 8.4–10.5)
Chloride: 104 mEq/L (ref 96–112)
Creatinine, Ser: 0.68 mg/dL (ref 0.40–1.20)
GFR: 122.05 mL/min (ref >60.00)
Glucose, Bld: 81 mg/dL (ref 70–99)
Potassium: 3.7 mEq/L (ref 3.5–5.1)
Sodium: 137 mEq/L (ref 135–145)
Total Bilirubin: 0.3 mg/dL (ref 0.2–1.2)
Total Protein: 7.4 g/dL (ref 6.0–8.3)

## 2019-05-24 LAB — CBC WITH DIFFERENTIAL/PLATELET
Basophils Absolute: 0.1 10*3/uL (ref 0.0–0.1)
Basophils Relative: 0.9 % (ref 0.0–3.0)
Eosinophils Absolute: 0.2 10*3/uL (ref 0.0–0.7)
Eosinophils Relative: 2.7 % (ref 0.0–5.0)
HCT: 37.1 % (ref 36.0–46.0)
Hemoglobin: 12 g/dL (ref 12.0–15.0)
Lymphocytes Relative: 38.9 % (ref 12.0–46.0)
Lymphs Abs: 2.5 10*3/uL (ref 0.7–4.0)
MCHC: 32.4 g/dL (ref 30.0–36.0)
MCV: 80.8 fl (ref 78.0–100.0)
Monocytes Absolute: 0.4 10*3/uL (ref 0.1–1.0)
Monocytes Relative: 6.5 % (ref 3.0–12.0)
Neutro Abs: 3.3 10*3/uL (ref 1.4–7.7)
Neutrophils Relative %: 51 % (ref 43.0–77.0)
Platelets: 305 10*3/uL (ref 150.0–400.0)
RBC: 4.59 Mil/uL (ref 3.87–5.11)
RDW: 15.7 % — ABNORMAL HIGH (ref 11.5–15.5)
WBC: 6.4 10*3/uL (ref 4.0–10.5)

## 2019-05-24 LAB — VITAMIN D 25 HYDROXY (VIT D DEFICIENCY, FRACTURES): VITD: 12.39 ng/mL — ABNORMAL LOW (ref 30.00–100.00)

## 2019-05-24 LAB — LIPID PANEL
Cholesterol: 192 mg/dL (ref 0–200)
HDL: 53.3 mg/dL (ref >39.00)
LDL Cholesterol: 129 mg/dL — ABNORMAL HIGH (ref 0–99)
NonHDL: 138.35
Total CHOL/HDL Ratio: 4
Triglycerides: 47 mg/dL (ref 0.0–149.0)
VLDL: 9.4 mg/dL (ref 0.0–40.0)

## 2019-05-24 LAB — TSH: TSH: 1.54 u[IU]/mL (ref 0.35–4.50)

## 2019-05-24 MED ORDER — MEDROXYPROGESTERONE ACETATE 150 MG/ML IM SUSY
150.0000 mg | PREFILLED_SYRINGE | Freq: Once | INTRAMUSCULAR | Status: AC
  Administered 2019-05-24: 12:00:00 150 mg via INTRAMUSCULAR

## 2019-05-24 MED ORDER — MEDROXYPROGESTERONE ACETATE 150 MG/ML IM SUSP: 150.0000 mg | INTRAMUSCULAR | 1 refills | Status: DC

## 2019-05-24 NOTE — Progress Notes (Signed)
Susan Solomon is a 33 y.o. female with the following history as recorded in Epic[employer]:  Patient Active Problem List   Diagnosis Date Noted  . Arrest of descent, delivered, current hospitalization 06/27/2014  . 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 ECZEMA, ATOPIC DERMATITIS 12/10/2006    Current Outpatient Medications  Medication Sig Dispense Refill  . medroxyPROGESTERone (DEPO-PROVERA) 150 MG/ML injection Inject 1 mL (150 mg total) into the muscle every 3 (three) months. 1 mL 1   No current facility-administered medications for this visit.     Allergies: Patient has no known allergies.  Past Medical History:  Diagnosis Date  . Abnormal Pap smear 03/29/2013   ASC-US +HPV  . Infection    UTI  . Vaginal Pap smear, abnormal     Past Surgical History:  Procedure Laterality Date  . CESAREAN SECTION N/A 06/26/2014   Procedure: CESAREAN SECTION;  Surgeon: Jerelyn Charles, MD;  Location: [facility];  Service: Obstetrics;  Laterality: N/A;  . NO PAST SURGERIES      Family History  Problem Relation Age of Onset  . Cancer Father        colon  . Cancer Paternal Aunt        colon  . Cancer Paternal Uncle        colon    Social History   Tobacco Use  . Smoking status: Former Research scientist (life sciences)  . Smokeless tobacco: Never Used  . Tobacco comment: prior to preg  Substance Use Topics  . Alcohol use: No    Subjective:  Patient presents as new patient; would like to get CPE updated/ pap smear; would like to get started on Depo-Provera- has taken in the past and done well; up to date on dentist and eye exam;   LMP- 05/19/2019- still on period today  Review of Systems  Constitutional: Negative.   HENT: Negative.   Eyes: Negative.   Respiratory: Negative.   Cardiovascular: Negative.   Gastrointestinal: Negative.   Genitourinary: Negative.   Musculoskeletal: Negative.   Skin:  Negative.   Neurological: Negative.   Endo/Heme/Allergies: Negative.   Psychiatric/Behavioral: Negative.         Objective:  Vitals:   05/24/19 1041  BP: 108/68  Pulse: 70  Temp: 98 F (36.7 C)  TempSrc: Oral  SpO2: 98%  Weight: 146 lb 1.3 oz (66.3 kg)  Height: '5\' 7"'  (1.702 m)    General: Well developed, well nourished, in no acute distress  Skin : Warm and dry.  Head: Normocephalic and atraumatic  Eyes: Sclera and conjunctiva clear; pupils round and reactive to light; extraocular movements intact  Ears: External normal; canals clear; tympanic membranes normal  Oropharynx: Pink, supple. No suspicious lesions  Neck: Supple without thyromegaly, adenopathy  Lungs: Respirations unlabored; clear to auscultation bilaterally without wheeze, rales, rhonchi  CVS exam: normal rate and regular rhythm.  Abdomen: Soft; nontender; nondistended; normoactive bowel sounds; no masses or hepatosplenomegaly  Musculoskeletal: No deformities; no active joint inflammation  Extremities: No edema, cyanosis, clubbing  Vessels: Symmetric bilaterally  Neurologic: Alert and oriented; speech intact; face symmetrical; moves all extremities well; CNII-XII intact without focal deficit  Pelvic exam: normal external genitalia, vulva, vagina, cervix, uterus and adnexa.  Assessment:  1. PE (physical exam), annual   2. Cervical cancer screening   3. Lipid screening   4. Screen for STD (sexually transmitted disease)   5. Uses Depo-Provera as primary birth  control method     Plan:  Age appropriate preventive healthcare needs addressed; encouraged regular eye doctor and dental exams; encouraged regular exercise; will update labs and refills as needed today; follow-up to be determined; Depo-Provera updated today- return in 3 months, sooner prn.   No follow-ups on file.  Orders Placed This Encounter  Procedures  . CBC w/Diff    Standing Status:   Future    Number of Occurrences:   1    Standing Expiration  Date:   05/23/2020  . Comp Met (CMET)    Standing Status:   Future    Number of Occurrences:   1    Standing Expiration Date:   05/23/2020  . Lipid panel    Standing Status:   Future    Number of Occurrences:   1    Standing Expiration Date:   05/23/2020  . TSH    Standing Status:   Future    Number of Occurrences:   1    Standing Expiration Date:   05/23/2020  . Vitamin D (25 hydroxy)    Standing Status:   Future    Number of Occurrences:   1    Standing Expiration Date:   05/23/2020  . RPR    Standing Status:   Future    Number of Occurrences:   1    Standing Expiration Date:   05/23/2020  . HIV Antibody (routine testing w rflx)    Standing Status:   Future    Number of Occurrences:   1    Standing Expiration Date:   05/23/2020    Requested Prescriptions   Signed Prescriptions Disp Refills  . medroxyPROGESTERone (DEPO-PROVERA) 150 MG/ML injection 1 mL 1    Sig: Inject 1 mL (150 mg total) into the muscle every 3 (three) months.

## 2019-05-24 NOTE — Patient Instructions (Signed)
Health Maintenance, Female Adopting a healthy lifestyle and getting preventive care are important in promoting health and wellness. Ask your health care provider about:  The right schedule for you to have regular tests and exams.  Things you can do on your own to prevent diseases and keep yourself healthy. What should I know about diet, weight, and exercise? Eat a healthy diet   Eat a diet that includes plenty of vegetables, fruits, low-fat dairy products, and lean protein.  Do not eat a lot of foods that are high in solid fats, added sugars, or sodium. Maintain a healthy weight Body mass index (BMI) is used to identify weight problems. It estimates body fat based on height and weight. Your health care provider can help determine your BMI and help you achieve or maintain a healthy weight. Get regular exercise Get regular exercise. This is one of the most important things you can do for your health. Most adults should:  Exercise for at least 150 minutes each week. The exercise should increase your heart rate and make you sweat (moderate-intensity exercise).  Do strengthening exercises at least twice a week. This is in addition to the moderate-intensity exercise.  Spend less time sitting. Even light physical activity can be beneficial. Watch cholesterol and blood lipids Have your blood tested for lipids and cholesterol at 31 years of age, then have this test every 5 years. Have your cholesterol levels checked more often if:  Your lipid or cholesterol levels are high.  You are older than 31 years of age.  You are at high risk for heart disease. What should I know about cancer screening? Depending on your health history and family history, you may need to have cancer screening at various ages. This may include screening for:  Breast cancer.  Cervical cancer.  Colorectal cancer.  Skin cancer.  Lung cancer. What should I know about heart disease, diabetes, and high blood  pressure? Blood pressure and heart disease  High blood pressure causes heart disease and increases the risk of stroke. This is more likely to develop in people who have high blood pressure readings, are of African descent, or are overweight.  Have your blood pressure checked: ? Every 3-5 years if you are 18-39 years of age. ? Every year if you are 40 years old or older. Diabetes Have regular diabetes screenings. This checks your fasting blood sugar level. Have the screening done:  Once every three years after age 40 if you are at a normal weight and have a low risk for diabetes.  More often and at a younger age if you are overweight or have a high risk for diabetes. What should I know about preventing infection? Hepatitis B If you have a higher risk for hepatitis B, you should be screened for this virus. Talk with your health care provider to find out if you are at risk for hepatitis B infection. Hepatitis C Testing is recommended for:  Everyone born from 1945 through 1965.  Anyone with known risk factors for hepatitis C. Sexually transmitted infections (STIs)  Get screened for STIs, including gonorrhea and chlamydia, if: ? You are sexually active and are younger than 31 years of age. ? You are older than 31 years of age and your health care provider tells you that you are at risk for this type of infection. ? Your sexual activity has changed since you were last screened, and you are at increased risk for chlamydia or gonorrhea. Ask your health care provider if   you are at risk.  Ask your health care provider about whether you are at high risk for HIV. Your health care provider may recommend a prescription medicine to help prevent HIV infection. If you choose to take medicine to prevent HIV, you should first get tested for HIV. You should then be tested every 3 months for as long as you are taking the medicine. Pregnancy  If you are about to stop having your period (premenopausal) and  you may become pregnant, seek counseling before you get pregnant.  Take 400 to 800 micrograms (mcg) of folic acid every day if you become pregnant.  Ask for birth control (contraception) if you want to prevent pregnancy. Osteoporosis and menopause Osteoporosis is a disease in which the bones lose minerals and strength with aging. This can result in bone fractures. If you are 65 years old or older, or if you are at risk for osteoporosis and fractures, ask your health care provider if you should:  Be screened for bone loss.  Take a calcium or vitamin D supplement to lower your risk of fractures.  Be given hormone replacement therapy (HRT) to treat symptoms of menopause. Follow these instructions at home: Lifestyle  Do not use any products that contain nicotine or tobacco, such as cigarettes, e-cigarettes, and chewing tobacco. If you need help quitting, ask your health care provider.  Do not use street drugs.  Do not share needles.  Ask your health care provider for help if you need support or information about quitting drugs. Alcohol use  Do not drink alcohol if: ? Your health care provider tells you not to drink. ? You are pregnant, may be pregnant, or are planning to become pregnant.  If you drink alcohol: ? Limit how much you use to 0-1 drink a day. ? Limit intake if you are breastfeeding.  Be aware of how much alcohol is in your drink. In the [place] one drink equals one 12 oz bottle of beer (355 mL), one 5 oz glass of wine (148 mL), or one 1 oz glass of hard liquor (44 mL). General instructions  Schedule regular health, dental, and eye exams.  Stay current with your vaccines.  Tell your health care provider if: ? You often feel depressed. ? You have ever been abused or do not feel safe at home. Summary  Adopting a healthy lifestyle and getting preventive care are important in promoting health and wellness.  Follow your health care provider's instructions about healthy  diet, exercising, and getting tested or screened for diseases.  Follow your health care provider's instructions on monitoring your cholesterol and blood pressure. This information is not intended to replace advice given to you by your health care provider. Make sure you discuss any questions you have with your health care provider. Document Released: 04/14/2011 Document Revised: 09/22/2018 Document Reviewed: 09/22/2018 Elsevier Patient Education  2020 [employer]vier Inc[employer]  

## 2019-05-25 LAB — CYTOLOGY - PAP
Chlamydia: NEGATIVE
Diagnosis: NEGATIVE
HPV: NOT DETECTED
Neisseria Gonorrhea: NEGATIVE
Trichomonas: NEGATIVE

## 2019-05-25 LAB — HIV ANTIBODY (ROUTINE TESTING W REFLEX): HIV 1&2 Ab, 4th Generation: NONREACTIVE

## 2019-05-25 LAB — RPR: RPR Ser Ql: NONREACTIVE

## 2019-05-27 ENCOUNTER — Other Ambulatory Visit: Payer: Self-pay | Admitting: Family

## 2019-05-27 MED ORDER — VITAMIN D (ERGOCALCIFEROL) 1.25 MG (50000 UNIT) PO CAPS: 50000.0000 [IU] | ORAL_CAPSULE | ORAL | 0 refills | Status: AC

## 2019-05-27 MED ORDER — FLUCONAZOLE 150 MG PO TABS: 150.0000 mg | ORAL_TABLET | Freq: Once | ORAL | 0 refills | Status: AC

## 2019-06-01 ENCOUNTER — Other Ambulatory Visit: Payer: Self-pay | Admitting: Family

## 2019-06-01 ENCOUNTER — Other Ambulatory Visit: Payer: Self-pay

## 2019-08-24 ENCOUNTER — Other Ambulatory Visit: Payer: Self-pay

## 2019-08-24 ENCOUNTER — Ambulatory Visit (INDEPENDENT_AMBULATORY_CARE_PROVIDER_SITE_OTHER): Payer: 59

## 2019-08-24 DIAGNOSIS — Z789 Other specified health status: Secondary | ICD-10-CM

## 2019-08-24 MED ORDER — MEDROXYPROGESTERONE ACETATE 150 MG/ML IM SUSP: 150.0000 mg | Freq: Once | INTRAMUSCULAR | Status: DC

## 2019-08-24 MED ORDER — MEDROXYPROGESTERONE ACETATE 150 MG/ML IM SUSP
150.0000 mg | Freq: Once | INTRAMUSCULAR | Status: AC
  Administered 2019-08-24: 150 mg via INTRAMUSCULAR

## 2019-08-28 ENCOUNTER — Other Ambulatory Visit: Payer: Self-pay | Admitting: Family

## 2019-08-29 NOTE — Progress Notes (Signed)
Agree with treatment 

## 2019-11-22 ENCOUNTER — Ambulatory Visit (INDEPENDENT_AMBULATORY_CARE_PROVIDER_SITE_OTHER): Payer: 59 | Admitting: *Deleted

## 2019-11-22 ENCOUNTER — Other Ambulatory Visit: Payer: Self-pay

## 2019-11-22 DIAGNOSIS — Z789 Other specified health status: Secondary | ICD-10-CM

## 2019-11-22 MED ORDER — MEDROXYPROGESTERONE ACETATE 150 MG/ML IM SUSP
150.0000 mg | Freq: Once | INTRAMUSCULAR | Status: AC
  Administered 2019-11-22: 150 mg via INTRAMUSCULAR

## 2019-11-25 ENCOUNTER — Ambulatory Visit: Payer: 59 | Admitting: Family

## 2019-12-02 NOTE — Progress Notes (Signed)
Agree with treatment 

## 2020-02-06 ENCOUNTER — Encounter: Payer: Self-pay | Admitting: Family

## 2020-02-06 NOTE — Telephone Encounter (Signed)
-----   Message from Olive Bass, FNP sent at 11/25/2019  1:44 PM EST ----- Need to call end of April/ early May to see if she wants to continue Depo or discuss alternative birth control options.

## 2020-02-20 ENCOUNTER — Ambulatory Visit (INDEPENDENT_AMBULATORY_CARE_PROVIDER_SITE_OTHER): Payer: 59 | Admitting: *Deleted

## 2020-02-20 ENCOUNTER — Other Ambulatory Visit: Payer: Self-pay

## 2020-02-20 DIAGNOSIS — Z789 Other specified health status: Secondary | ICD-10-CM

## 2020-02-20 MED ORDER — MEDROXYPROGESTERONE ACETATE 150 MG/ML IM SUSP
150.0000 mg | Freq: Once | INTRAMUSCULAR | Status: AC
  Administered 2020-02-20: 150 mg via INTRAMUSCULAR

## 2020-02-20 NOTE — Progress Notes (Signed)
Pls cosign for Depo provera../lmb 

## 2020-05-14 ENCOUNTER — Encounter: Payer: Self-pay | Admitting: Family

## 2020-05-14 NOTE — Telephone Encounter (Signed)
Notified pt via Zadie Rhine is out of the office this week. Will respond once she returns.Marland KitchenRaechel Chute

## 2020-05-23 ENCOUNTER — Ambulatory Visit: Payer: 59

## 2020-05-23 ENCOUNTER — Other Ambulatory Visit: Payer: Self-pay

## 2020-05-23 ENCOUNTER — Encounter: Payer: Self-pay | Admitting: Family

## 2020-05-23 ENCOUNTER — Ambulatory Visit: Payer: 59 | Admitting: Family

## 2020-05-23 VITALS — BP 104/80 | HR 75 | Temp 98.0°F | Ht 67.0 in | Wt 143.0 lb

## 2020-05-23 DIAGNOSIS — E559 Vitamin D deficiency, unspecified: Secondary | ICD-10-CM

## 2020-05-23 DIAGNOSIS — R5383 Other fatigue: Secondary | ICD-10-CM

## 2020-05-23 DIAGNOSIS — Z3009 Encounter for other general counseling and advice on contraception: Secondary | ICD-10-CM

## 2020-05-23 MED ORDER — LEVONORGESTREL-ETHINYL ESTRAD 0.15-30 MG-MCG PO TABS: 1.0000 | ORAL_TABLET | Freq: Every day | ORAL | 3 refills | Status: DC

## 2020-05-23 NOTE — Progress Notes (Signed)
Susan Solomon is a 34 y.o. female with the following history as recorded in EpicCare:  Patient Active Problem List   Diagnosis Date Noted  . Arrest of descent, delivered, current hospitalization 06/27/2014  . 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 ECZEMA, ATOPIC DERMATITIS 12/10/2006    Current Outpatient Medications  Medication Sig Dispense Refill  . levonorgestrel-ethinyl estradiol (NORDETTE) 0.15-30 MG-MCG tablet Take 1 tablet by mouth daily. 84 tablet 3   No current facility-administered medications for this visit.    Allergies: Patient has no known allergies.  Past Medical History:  Diagnosis Date  . Abnormal Pap smear 03/29/2013   ASC-US +HPV  . Infection    UTI  . Vaginal Pap smear, abnormal     Past Surgical History:  Procedure Laterality Date  . CESAREAN SECTION N/A 06/26/2014   Procedure: CESAREAN SECTION;  Surgeon: Jerelyn Charles, MD;  Location: [facility];  Service: Obstetrics;  Laterality: N/A;  . NO PAST SURGERIES      Family History  Problem Relation Age of Onset  . Cancer Father        colon  . Cancer Paternal Aunt        colon  . Cancer Paternal Uncle        colon    Social History   Tobacco Use  . Smoking status: Former Research scientist (life sciences)  . Smokeless tobacco: Never Used  . Tobacco comment: prior to preg  Substance Use Topics  . Alcohol use: No    Subjective:  Accompanied by her husband; wants to discuss options for contraception other than Depo; does not want Nexplanon or IUD; would like to go back on OCPs; pap smear is up to date; STD screen done last year- in stable relationship;  Objective:  Vitals:   05/23/20 1127  BP: 104/80  Pulse: 75  Temp: 98 F (36.7 C)  TempSrc: Oral  SpO2: 99%  Weight: 143 lb (64.9 kg)  Height: _0  (1.702 m)    General: Well developed, well nourished, in no acute distress  Skin : Warm and dry.  Head:  Normocephalic and atraumatic  Lungs: Respirations unlabored;  Vessels: Symmetric bilaterally  Neurologic: Alert and oriented; speech intact; face symmetrical; moves all extremities well; CNII-XII intact without focal deficit   Pelvic exam: normal external genitalia, vulva, vagina, uterus and adnexa. Assessment:  1. Other fatigue   2. Vitamin D deficiency   3. Encounter for counseling regarding contraception     Plan:  1. Check CBC, CMP, TSH today; 2. Check Vitamin D level; 3. Will d/c Depo- change to OCP; will use one she has done well on in the past; risks/ benefits discussed; need for back up method x 1 month; Pap smear due in 2023;  Time spent 30 minutes  This visit occurred during the SARS-CoV-2 public health emergency.  Safety protocols were in place, including screening questions prior to the visit, additional usage of staff PPE, and extensive cleaning of exam room while observing appropriate contact time as indicated for disinfecting solutions.     No follow-ups on file.  Orders Placed This Encounter  Procedures  . Vitamin D (25 hydroxy)    Standing Status:   Future    Number of Occurrences:   1    Standing Expiration Date:   05/23/2021  . CBC with Differential/Platelet    Standing Status:   Future    Number of Occurrences:  1    Standing Expiration Date:   05/23/2021  . Comp Met (CMET)    Standing Status:   Future    Number of Occurrences:   1    Standing Expiration Date:   05/23/2021  . TSH    Standing Status:   Future    Number of Occurrences:   1    Standing Expiration Date:   05/23/2021    Requested Prescriptions   Signed Prescriptions Disp Refills  . levonorgestrel-ethinyl estradiol (NORDETTE) 0.15-30 MG-MCG tablet 84 tablet 3    Sig: Take 1 tablet by mouth daily.

## 2020-05-24 LAB — CBC WITH DIFFERENTIAL/PLATELET
Absolute Monocytes: 534 cells/uL (ref 200–950)
Basophils Absolute: 96 cells/uL (ref 0–200)
Basophils Relative: 0.7 %
Eosinophils Absolute: 397 cells/uL (ref 15–500)
Eosinophils Relative: 2.9 %
HCT: 41.9 % (ref 35.0–45.0)
Hemoglobin: 13.5 g/dL (ref 11.7–15.5)
Lymphs Abs: 3233 cells/uL (ref 850–3900)
MCH: 27.7 pg (ref 27.0–33.0)
MCHC: 32.2 g/dL (ref 32.0–36.0)
MCV: 85.9 fL (ref 80.0–100.0)
MPV: 9.8 fL (ref 7.5–12.5)
Monocytes Relative: 3.9 %
Neutro Abs: 9439 cells/uL — ABNORMAL HIGH (ref 1500–7800)
Neutrophils Relative %: 68.9 %
Platelets: 277 10*3/uL (ref 140–400)
RBC: 4.88 10*6/uL (ref 3.80–5.10)
RDW: 13.4 % (ref 11.0–15.0)
Total Lymphocyte: 23.6 %
WBC: 13.7 10*3/uL — ABNORMAL HIGH (ref 3.8–10.8)

## 2020-05-24 LAB — COMPREHENSIVE METABOLIC PANEL
AG Ratio: 1.7 (calc) (ref 1.0–2.5)
ALT: 8 U/L (ref 6–29)
AST: 12 U/L (ref 10–30)
Albumin: 4.7 g/dL (ref 3.6–5.1)
Alkaline phosphatase (APISO): 47 U/L (ref 31–125)
BUN: 8 mg/dL (ref 7–25)
CO2: 24 mmol/L (ref 20–32)
Calcium: 9.7 mg/dL (ref 8.6–10.2)
Chloride: 105 mmol/L (ref 98–110)
Creat: 0.72 mg/dL (ref 0.50–1.10)
Globulin: 2.8 g/dL (calc) (ref 1.9–3.7)
Glucose, Bld: 78 mg/dL (ref 65–99)
Potassium: 3.7 mmol/L (ref 3.5–5.3)
Sodium: 139 mmol/L (ref 135–146)
Total Bilirubin: 0.6 mg/dL (ref 0.2–1.2)
Total Protein: 7.5 g/dL (ref 6.1–8.1)

## 2020-05-24 LAB — TSH: TSH: 1.96 mIU/L

## 2020-05-24 LAB — VITAMIN D 25 HYDROXY (VIT D DEFICIENCY, FRACTURES): Vit D, 25-Hydroxy: 14 ng/mL — ABNORMAL LOW (ref 30–100)

## 2020-05-25 ENCOUNTER — Other Ambulatory Visit: Payer: Self-pay | Admitting: Family

## 2020-05-25 DIAGNOSIS — D72829 Elevated white blood cell count, unspecified: Secondary | ICD-10-CM

## 2020-05-25 MED ORDER — VITAMIN D (ERGOCALCIFEROL) 1.25 MG (50000 UNIT) PO CAPS: 50000.0000 [IU] | ORAL_CAPSULE | ORAL | 0 refills | Status: AC

## 2020-06-06 ENCOUNTER — Encounter: Payer: Self-pay | Admitting: Family

## 2020-08-27 ENCOUNTER — Other Ambulatory Visit: Payer: Self-pay | Admitting: Family

## 2020-10-24 ENCOUNTER — Other Ambulatory Visit: Payer: Self-pay

## 2020-10-24 ENCOUNTER — Ambulatory Visit: Payer: 59 | Admitting: Internal Medicine

## 2020-10-24 ENCOUNTER — Encounter: Payer: Self-pay | Admitting: Internal Medicine

## 2020-10-24 DIAGNOSIS — M5416 Radiculopathy, lumbar region: Secondary | ICD-10-CM

## 2020-10-24 MED ORDER — IBUPROFEN 800 MG PO TABS: 800.0000 mg | ORAL_TABLET | Freq: Three times a day (TID) | ORAL | 0 refills | Status: DC | PRN

## 2020-10-24 MED ORDER — HYDROCODONE-ACETAMINOPHEN 5-325 MG PO TABS: 1.0000 | ORAL_TABLET | Freq: Four times a day (QID) | ORAL | 0 refills | Status: DC | PRN

## 2020-10-24 MED ORDER — PREDNISONE 10 MG PO TABS
ORAL_TABLET | ORAL | 0 refills | Status: DC
Start: 2020-10-24 — End: 2021-04-11

## 2020-10-24 NOTE — Assessment & Plan Note (Signed)
Acute onset but constant, severe left sided with LLE radicular pain and weakness - for pain control - hydrocodone and ibuprofen, predpac, and MRI LS spine; I suspect most likely disc herniation related vs other

## 2020-10-24 NOTE — Progress Notes (Signed)
Established Patient Office Visit  Subjective:  Patient ID: Susan Solomon, female    DOB: 1985/11/29  Age: 35 y.o. MRN: [id number]      Chief Complaint: 2 wks low back pain       HPI:  Susan Solomon is a 35 y.o. female here with c/o 2 wks onset mid low and left back pain with radiation to the left knee maybe slightly below, severe, constant, sharp and tingling, worse to stand up, sit, and walking, not better to lying down, not associated with numbness but with weakness mild to LLE, and has to pick up the left leg with her hands to get into the car; no falls or injury.  Denies fever, Denies worsening reflux, abd pain, dysphagia, n/v, bowel change or blood.  Denies urinary symptoms such as dysuria, frequency, urgency, flank pain, hematuria or n/v, fever, chills.  No obvious inciting event such as heavy lifting.  No gyn concerns she is aware or change in menses.   .        Wt Readings from Last 3 Encounters:  10/24/20 142 lb (64.4 kg)  05/23/20 143 lb (64.9 kg)  05/24/19 146 lb 1.3 oz (66.3 kg)   BP Readings from Last 3 Encounters:  10/24/20 92/68  05/23/20 104/80  05/24/19 108/68       Past Medical History:  Diagnosis Date  . Abnormal Pap smear 03/29/2013   ASC-US +HPV  . Infection    UTI  . Vaginal Pap smear, abnormal    Past Surgical History:  Procedure Laterality Date  . CESAREAN SECTION N/A 06/26/2014   Procedure: CESAREAN SECTION;  Surgeon: Marlow Baars, MD;  Location: [facility];  Service: Obstetrics;  Laterality: N/A;  . NO PAST SURGERIES      reports that she has quit smoking. She has never used smokeless tobacco. She reports that she does not drink alcohol and does not use drugs. family history includes Cancer in her father, paternal aunt, and paternal uncle. No Known Allergies Current Outpatient Medications on File Prior to Visit  Medication Sig Dispense Refill  . levonorgestrel-ethinyl estradiol (NORDETTE) 0.15-30 MG-MCG tablet Take 1 tablet by mouth daily. 84  tablet 3   No current facility-administered medications on file prior to visit.        ROS:  All others reviewed and negative.  Objective        PE:  BP 92/68 (BP Location: Left Arm)   Pulse 92   Temp 97.8 F (36.6 C) (Oral)   Wt 142 lb (64.4 kg)   SpO2 99%   BMI 22.24 kg/m                 Constitutional: Pt appears in NAD               HENT: Head: NCAT.                Right Ear: External ear normal.                 Left Ear: External ear normal.                Eyes: . Pupils are equal, round, and reactive to light. Conjunctivae and EOM are normal               Nose: without d/c or deformity               Neck: Neck supple. Gross normal ROM  Cardiovascular: Normal rate and regular rhythm.                 Pulmonary/Chest: Effort normal and breath sounds without rales or wheezing.                Abd:  Soft, NT, ND, + BS, no organomegaly               Spine - nontender in midline, as well as left lumbar paravertebral, buttock and leg               Neurological: Pt is alert. At baseline orientation, motor grossly intact except for 4+/5 LLE weakness               Skin: Skin is warm. No rashes, no other new lesions, LE edema - none               Psychiatric: Pt behavior is normal without agitation   Assessment/Plan:  Susan Solomon is a 35 y.o. Black or African American [2] female with  has a past medical history of Abnormal Pap smear (03/29/2013), Infection, and Vaginal Pap smear, abnormal.   Assessment Plan  See problem oriented assessment and plan Labs reviewed for each problem: Lab Results  Component Value Date   WBC 13.7 (H) 05/23/2020   HGB 13.5 05/23/2020   HCT 41.9 05/23/2020   PLT 277 05/23/2020   GLUCOSE 78 05/23/2020   CHOL 192 05/24/2019   TRIG 47.0 05/24/2019   HDL 53.30 05/24/2019   LDLCALC 129 (H) 05/24/2019   ALT 8 05/23/2020   AST 12 05/23/2020   NA 139 05/23/2020   K 3.7 05/23/2020   CL 105 05/23/2020   CREATININE 0.72 05/23/2020   BUN  8 05/23/2020   CO2 24 05/23/2020   TSH 1.96 05/23/2020    Micro: none  Cardiac tracings I have personally interpreted today:  none  Pertinent Radiological findings (summarize): none    Health Maintenance Due  Topic Date Due  . INFLUENZA VACCINE  05/13/2020    There are no preventive care reminders to display for this patient.  Lab Results  Component Value Date   TSH 1.96 05/23/2020   Lab Results  Component Value Date   WBC 13.7 (H) 05/23/2020   HGB 13.5 05/23/2020   HCT 41.9 05/23/2020   MCV 85.9 05/23/2020   PLT 277 05/23/2020   Lab Results  Component Value Date   NA 139 05/23/2020   K 3.7 05/23/2020   CO2 24 05/23/2020   GLUCOSE 78 05/23/2020   BUN 8 05/23/2020   CREATININE 0.72 05/23/2020   BILITOT 0.6 05/23/2020   ALKPHOS 46 05/24/2019   AST 12 05/23/2020   ALT 8 05/23/2020   PROT 7.5 05/23/2020   ALBUMIN 4.5 05/24/2019   CALCIUM 9.7 05/23/2020   ANIONGAP 23 (H) 07/24/2014   GFR 122.05 05/24/2019   Lab Results  Component Value Date   CHOL 192 05/24/2019   Lab Results  Component Value Date   HDL 53.30 05/24/2019   Lab Results  Component Value Date   LDLCALC 129 (H) 05/24/2019   Lab Results  Component Value Date   TRIG 47.0 05/24/2019   Lab Results  Component Value Date   CHOLHDL 4 05/24/2019   No results found for: HGBA1C    Assessment & Plan:   Problem List Items Addressed This Visit      High   Left lumbar radiculopathy    Acute onset but constant, severe left  sided with LLE radicular pain and weakness - for pain control - hydrocodone and ibuprofen, predpac, and MRI LS spine; I suspect most likely disc herniation related vs other      Relevant Orders   MR Lumbar Spine Wo Contrast      Meds ordered this encounter  Medications  . HYDROcodone-acetaminophen (NORCO/VICODIN) 5-325 MG tablet    Sig: Take 1 tablet by mouth every 6 (six) hours as needed.    Dispense:  30 tablet    Refill:  0  . ibuprofen (ADVIL) 800 MG tablet     Sig: Take 1 tablet (800 mg total) by mouth every 8 (eight) hours as needed.    Dispense:  30 tablet    Refill:  0  . predniSONE (DELTASONE) 10 MG tablet    Sig: 3 tabs by mouth per day for 3 days,2tabs per day for 3 days,1tab per day for 3 days    Dispense:  18 tablet    Refill:  0    Follow-up: Return if symptoms worsen or fail to improve.   Oliver Barre, MD 10/24/2020 9:28 PM [facility]

## 2020-10-24 NOTE — Patient Instructions (Addendum)
Please take all new medication as prescribed - the hydrocodone and/or ibuprofen as needed, and prednisone  Please continue all other medications as before, and refills have been done if requested.  Please have the pharmacy call with any other refills you may need.  Please continue your efforts at being more active, low cholesterol diet, and weight control.  Please keep your appointments with your specialists as you may have planned  You will be contacted regarding the referral for: MRI

## 2020-11-09 ENCOUNTER — Ambulatory Visit
Admission: RE | Admit: 2020-11-09 | Discharge: 2020-11-09 | Disposition: A | Discharge status: Home / Self Care | Payer: 59 | Source: Ambulatory Visit | Attending: Internal Medicine | Admitting: Internal Medicine

## 2020-11-09 ENCOUNTER — Encounter: Payer: Self-pay | Admitting: Internal Medicine

## 2020-11-09 ENCOUNTER — Other Ambulatory Visit: Payer: Self-pay

## 2020-11-09 ENCOUNTER — Other Ambulatory Visit: Payer: Self-pay | Admitting: Internal Medicine

## 2020-11-09 DIAGNOSIS — M5416 Radiculopathy, lumbar region: Secondary | ICD-10-CM

## 2021-03-03 ENCOUNTER — Other Ambulatory Visit: Payer: Self-pay

## 2021-03-03 ENCOUNTER — Emergency Department (HOSPITAL_COMMUNITY)
Admission: EM | Admit: 2021-03-03 | Discharge: 2021-03-03 | Disposition: A | Discharge status: Home / Self Care | Payer: 59 | Attending: Emergency Medicine | Admitting: Emergency Medicine

## 2021-03-03 ENCOUNTER — Encounter (HOSPITAL_COMMUNITY): Payer: Self-pay | Admitting: Emergency Medicine

## 2021-03-03 DIAGNOSIS — M545 Low back pain, unspecified: Secondary | ICD-10-CM | POA: Insufficient documentation

## 2021-03-03 DIAGNOSIS — Z5321 Procedure and treatment not carried out due to patient leaving prior to being seen by health care provider: Secondary | ICD-10-CM | POA: Insufficient documentation

## 2021-03-03 DIAGNOSIS — G8929 Other chronic pain: Secondary | ICD-10-CM | POA: Insufficient documentation

## 2021-03-03 NOTE — ED Provider Notes (Signed)
Emergency Medicine Provider Triage Evaluation Note  Susan Solomon , a 35 y.o. female  was evaluated in triage.  Pt complains of acute on chronic low back pain that radiates down right leg. History of low back pain and sees Dr. Maurice Small with neurosurgery. Has had an MRI which showed a herniated disc. She had a steroid injection 3 months ago and is due for another one. Denies trauma, saddle paraesthesias, bowel/bladder incontinence, fever/chills, lower extremity numbness/tingling, lower extremity weakness.   Review of Systems  Positive: Back pain Negative: fever  Physical Exam  There were no vitals taken for this visit. Gen:   Awake, no distress   Resp:  Normal effort  MSK:   Moves extremities without difficulty  Other:  Bilateral lumbar paraspinal tenderness  Medical Decision Making  Medically screening exam initiated at 5:19 PM.  Appropriate orders placed.  Susan Solomon was informed that the remainder of the evaluation will be completed by another provider, this initial triage assessment does not replace that evaluation, and the importance of remaining in the ED until their evaluation is complete.  Acute on chronic low back pain.    Jesusita Oka 03/03/21 1749    Jacalyn Lefevre, MD 03/03/21 [phone]

## 2021-03-03 NOTE — ED Triage Notes (Signed)
Pt coming from home. Complaint of back pain that radiates into right leg since yesterday.

## 2021-03-03 NOTE — ED Notes (Signed)
Called for repeat VS x3, no response. 

## 2021-03-06 ENCOUNTER — Encounter: Payer: Self-pay | Admitting: Family

## 2021-04-11 ENCOUNTER — Other Ambulatory Visit: Payer: Self-pay

## 2021-04-11 ENCOUNTER — Ambulatory Visit: Payer: 59 | Admitting: Family

## 2021-04-11 ENCOUNTER — Encounter: Payer: Self-pay | Admitting: Family

## 2021-04-11 VITALS — BP 102/60 | HR 78 | Temp 97.8°F | Resp 16 | Ht 67.0 in | Wt 141.0 lb

## 2021-04-11 DIAGNOSIS — D72829 Elevated white blood cell count, unspecified: Secondary | ICD-10-CM

## 2021-04-11 DIAGNOSIS — R55 Syncope and collapse: Secondary | ICD-10-CM | POA: Diagnosis not present

## 2021-04-11 DIAGNOSIS — R5383 Other fatigue: Secondary | ICD-10-CM | POA: Diagnosis not present

## 2021-04-11 DIAGNOSIS — Z30011 Encounter for initial prescription of contraceptive pills: Secondary | ICD-10-CM

## 2021-04-11 MED ORDER — LEVONORGESTREL-ETHINYL ESTRAD 0.15-30 MG-MCG PO TABS: 1.0000 | ORAL_TABLET | Freq: Every day | ORAL | 1 refills | Status: DC

## 2021-04-11 NOTE — Progress Notes (Signed)
Susan Solomon is a 35 y.o. female with the following history as recorded in EpicCare:  Patient Active Problem List   Diagnosis Date Noted   Left lumbar radiculopathy 10/24/2020   Arrest of descent, delivered, current hospitalization 06/27/2014   Cyst of ovary 11/01/2013   Atypical squamous cell changes of undetermined significance (ASCUS) on cervical cytology with positive high risk human papilloma virus (HPV) 05/03/2013   ECZEMA, ATOPIC DERMATITIS 12/10/2006    Current Outpatient Medications  Medication Sig Dispense Refill   levonorgestrel-ethinyl estradiol (NORDETTE) 0.15-30 MG-MCG tablet Take 1 tablet by mouth daily. 84 tablet 1   No current facility-administered medications for this visit.    Allergies: Patient has no known allergies.  Past Medical History:  Diagnosis Date   Abnormal Pap smear 03/29/2013   ASC-US +HPV   Infection    UTI   Vaginal Pap smear, abnormal     Past Surgical History:  Procedure Laterality Date   CESAREAN SECTION N/A 06/26/2014   Procedure: CESAREAN SECTION;  Surgeon: Jerelyn Charles, MD;  Location: [facility];  Service: Obstetrics;  Laterality: N/A;   NO PAST SURGERIES      Family History  Problem Relation Age of Onset   Cancer Father        colon   Cancer Paternal Aunt        colon   Cancer Paternal Uncle        colon    Social History   Tobacco Use   Smoking status: Former    Pack years: 0.00   Smokeless tobacco: Never   Tobacco comments:    prior to preg  Substance Use Topics   Alcohol use: No    Subjective:  Blood pressure has been noted to be low at recent OV with neurology; patient denies any dizziness or concerns but was told to follow up to get re-checked;  Does notes that she may "faint" up to 2 x per year- notes these symptoms started at age 66; has never seen cardiology or neurology; per patient, she "can feel when the episode is coming will lose consciousness- per patient, she can "be out of it for 3-5 minutes" and then will  come back around; "  Notes that last episode was in August 2021 while she was shopping;   LMP June 9/ not currently on OCPs   Objective:  Vitals:   04/11/21 1412  BP: 102/60  Pulse: 78  Resp: 16  Temp: 97.8 F (36.6 C)  SpO2: 95%  Weight: 141 lb (64 kg)  Height: '5\' 7"'  (1.702 m)    General: Well developed, well nourished, in no acute distress  Skin : Warm and dry.  Head: Normocephalic and atraumatic  Eyes: Sclera and conjunctiva clear; pupils round and reactive to light; extraocular movements intact  Ears: External normal; canals clear; tympanic membranes normal  Oropharynx: Pink, supple. No suspicious lesions  Neck: Supple without thyromegaly, adenopathy  Lungs: Respirations unlabored; clear to auscultation bilaterally without wheeze, rales, rhonchi  CVS exam: normal rate and regular rhythm.  Neurologic: Alert and oriented; speech intact; face symmetrical; moves all extremities well; CNII-XII intact without focal deficit   Assessment:  1. Leukocytosis, unspecified type   2. Syncope, unspecified syncope type   3. Other fatigue   4. OCP (oral contraceptive pills) initiation     Plan:  Check CBC today; ? Vasovagal vs hypoglycemic events vs possible seizure; refer to cardiology and neurology; Check TSH, B12, CMP today; Check Hcg- patient denies chance of being  pregnant; re-start OCPs;  This visit occurred during the SARS-CoV-2 public health emergency.  Safety protocols were in place, including screening questions prior to the visit, additional usage of staff PPE, and extensive cleaning of exam room while observing appropriate contact time as indicated for disinfecting solutions.    No follow-ups on file.  Orders Placed This Encounter  Procedures   CBC with Differential/Platelet   CBC with Differential/Platelet   Comp Met (CMET)   TSH   Vitamin B12   hCG, serum, qualitative   Hemoglobin A1c   Ambulatory referral to Cardiology    Referral Priority:   Routine     Referral Type:   Consultation    Referral Reason:   Specialty Services Required    Requested Specialty:   Cardiology    Number of Visits Requested:   1   Ambulatory referral to Neurology    Referral Priority:   Routine    Referral Type:   Consultation    Referral Reason:   Specialty Services Required    Requested Specialty:   Neurology    Number of Visits Requested:   1    Requested Prescriptions   Signed Prescriptions Disp Refills   levonorgestrel-ethinyl estradiol (NORDETTE) 0.15-30 MG-MCG tablet 84 tablet 1    Sig: Take 1 tablet by mouth daily.

## 2021-04-12 LAB — CBC WITH DIFFERENTIAL/PLATELET
Basophils Absolute: 0.1 10*3/uL (ref 0.0–0.1)
Basophils Relative: 1 % (ref 0.0–3.0)
Eosinophils Absolute: 0 10*3/uL (ref 0.0–0.7)
Eosinophils Relative: 0.5 % (ref 0.0–5.0)
HCT: 40.5 % (ref 36.0–46.0)
Hemoglobin: 13.2 g/dL (ref 12.0–15.0)
Lymphocytes Relative: 40.6 % (ref 12.0–46.0)
Lymphs Abs: 2.7 10*3/uL (ref 0.7–4.0)
MCHC: 32.7 g/dL (ref 30.0–36.0)
MCV: 84.2 fl (ref 78.0–100.0)
Monocytes Absolute: 0.6 10*3/uL (ref 0.1–1.0)
Monocytes Relative: 8.6 % (ref 3.0–12.0)
Neutro Abs: 3.2 10*3/uL (ref 1.4–7.7)
Neutrophils Relative %: 49.3 % (ref 43.0–77.0)
Platelets: 275 10*3/uL (ref 150.0–400.0)
RBC: 4.81 Mil/uL (ref 3.87–5.11)
RDW: 14 % (ref 11.5–15.5)
WBC: 6.6 10*3/uL (ref 4.0–10.5)

## 2021-04-12 LAB — COMPREHENSIVE METABOLIC PANEL
ALT: 7 U/L (ref 0–35)
AST: 12 U/L (ref 0–37)
Albumin: 4.7 g/dL (ref 3.5–5.2)
Alkaline Phosphatase: 46 U/L (ref 39–117)
BUN: 10 mg/dL (ref 6–23)
CO2: 27 mEq/L (ref 19–32)
Calcium: 9.5 mg/dL (ref 8.4–10.5)
Chloride: 106 mEq/L (ref 96–112)
Creatinine, Ser: 0.73 mg/dL (ref 0.40–1.20)
GFR: 108.4 mL/min (ref >60.00)
Glucose, Bld: 83 mg/dL (ref 70–99)
Potassium: 3.9 mEq/L (ref 3.5–5.1)
Sodium: 141 mEq/L (ref 135–145)
Total Bilirubin: 0.5 mg/dL (ref 0.2–1.2)
Total Protein: 7.7 g/dL (ref 6.0–8.3)

## 2021-04-12 LAB — HEMOGLOBIN A1C: Hgb A1c MFr Bld: 5.6 % (ref 4.6–6.5)

## 2021-04-12 LAB — HCG, SERUM, QUALITATIVE: Preg, Serum: NEGATIVE

## 2021-04-12 LAB — VITAMIN B12: Vitamin B-12: 265 pg/mL (ref 211–911)

## 2021-04-12 LAB — TSH: TSH: 1.69 u[IU]/mL (ref 0.35–5.50)

## 2021-04-26 ENCOUNTER — Other Ambulatory Visit: Payer: Self-pay

## 2021-04-26 ENCOUNTER — Ambulatory Visit: Payer: 59 | Attending: Neurological Surgery

## 2021-04-26 DIAGNOSIS — M5441 Lumbago with sciatica, right side: Secondary | ICD-10-CM | POA: Insufficient documentation

## 2021-04-26 DIAGNOSIS — M6281 Muscle weakness (generalized): Secondary | ICD-10-CM | POA: Diagnosis present

## 2021-04-26 DIAGNOSIS — G8929 Other chronic pain: Secondary | ICD-10-CM | POA: Diagnosis present

## 2021-04-26 DIAGNOSIS — M5442 Lumbago with sciatica, left side: Secondary | ICD-10-CM | POA: Diagnosis present

## 2021-04-26 NOTE — Therapy (Signed)
[facility] Outpatient Rehabilitation [facility] [address] [place], [place], [address] Phone: [phone]   Fax:  [phone]  Physical Therapy Evaluation  Patient Details  Name: Susan Solomon MRN: [id number] Date of Birth: May 16, 1986 Referring Provider (PT): Jadene Pierini   Encounter Date: 04/26/2021   PT End of Session - 04/26/21 0921     Visit Number 1    Number of Visits 17    Date for PT Re-Evaluation 06/21/21    Authorization Type UHC 2022, 60 VL, No auth required    PT Start Time 0830    PT Stop Time 0915    PT Time Calculation (min) 45 min    Activity Tolerance Patient tolerated treatment well    Behavior During Therapy [facility] for tasks assessed/performed             Past Medical History:  Diagnosis Date   Abnormal Pap smear 03/29/2013   ASC-US +HPV   Infection    UTI   Vaginal Pap smear, abnormal     Past Surgical History:  Procedure Laterality Date   CESAREAN SECTION N/A 06/26/2014   Procedure: CESAREAN SECTION;  Surgeon: Marlow Baars, MD;  Location: [facility];  Service: Obstetrics;  Laterality: N/A;   NO PAST SURGERIES      There were no vitals filed for this visit.    Subjective Assessment - 04/26/21 0836     Subjective Pt reports to clinic with primary c/o LBP of insidious onset start in the first week of January 2022. Symptoms started to travel to the L leg just behind the knee shortly after onset of LBP. She received a steroid injection in February, which caused the pain to dissipate x3 weeks, but then came back 10x worse. At this time, the pain began traveling to the R leg only down to the R foot. She reports that her toes would intermittently "lock," occuring 2x per day. She received a 2nd steroid shot in May, resulting in decreased pain and radicular sxs x36month. Pain resumed in June, and at this time, pain is centralized to the global BIL lumbar region and BIL posterior hips. Pt reports increased pain when laying down (doesn't  matter which position when laying down), as well as hip hinging/ bending over. She reports being woken by pain about every hour, requiring her to reposition to go back to sleep. Also, sitting for longer than 15 minutes increases her pain. Current pain is 6/10, worst is 9.5/10, best is 3/10. Pain alieviating factors include walking/ stretching (lumbar extension).    Limitations Sitting;Lifting;Other (comment)   bending   How long can you sit comfortably? 15 minutes    How long can you stand comfortably? Unlimited    How long can you walk comfortably? Unlimited    Diagnostic tests 11/09/2020 MRI Lumbar without contrast: IMPRESSION:  1. Focal right paracentral disc protrusion at L5-S1 resulting in  mass effect upon the descending right S1 nerve root. Correlate for  radicular symptoms in this distribution.  2. Minimal diffuse disc bulge at L4-L5.  3. No canal or foraminal stenosis at any level.  4. Remaining lumbar intervertebral disc levels are within normal  limits.    Patient Stated Goals Sleep    Currently in Pain? Yes    Pain Score 6     Pain Location Back    Pain Orientation Right;Left;Lower;Medial    Pain Descriptors / Indicators Burning;Aching;Pins and needles    Pain Type Chronic pain    Pain Radiating Towards BIL  posterior hips    Pain Onset More than a month ago    Pain Frequency Constant    Aggravating Factors  Prolonged sitting, laying down, standing from a seated position    Pain Relieving Factors stretching (lumbar extension), walking    Effect of Pain on Daily Activities Difficulty sitting at work, sleeping    Multiple Pain Sites No                [facility] PT Assessment - 04/26/21 0001       Assessment   Medical Diagnosis Radiculopathy, lumbar region (M54.16)    Referring Provider (PT) Autumn Patty A    Onset Date/Surgical Date 10/13/20    Hand Dominance Right    Next MD Visit Neurology appointment Aug 26th    Prior Therapy None      Precautions   Precautions None       Restrictions   Weight Bearing Restrictions No      Balance Screen   Has the patient fallen in the past 6 months No    Has the patient had a decrease in activity level because of a fear of falling?  No    Is the patient reluctant to leave their home because of a fear of falling?  No      Prior Function   Level of Independence Independent      Cognition   Overall Cognitive Status Within Functional Limits for tasks assessed      Observation/Other Assessments   Focus on Therapeutic Outcomes (FOTO)  55%   Projected to 72% at visit 11     Squat   Comments 70%, pain upon ascent      AROM   AROM Assessment Site Lumbar    Lumbar Flexion 45   pain   Lumbar Extension 15   pain   Lumbar - Right Side Bend 16cm   pain   Lumbar - Left Side Bend 14cm   pain   Lumbar - Right Rotation 68   pain   Lumbar - Left Rotation 80   pain     Strength   Right Hip Flexion 4+/5    Right Hip Extension 4/5    Right Hip ABduction 4-/5    Left Hip Flexion 4/5    Left Hip Extension 4/5    Left Hip ABduction 4+/5    Right Knee Flexion 5/5    Right Knee Extension 4+/5    Left Knee Flexion 5/5    Left Knee Extension 5/5    Right Ankle Dorsiflexion 4+/5   elicits LBP   Right Ankle Plantar Flexion 5/5    Left Ankle Dorsiflexion 5/5    Left Ankle Plantar Flexion 5/5      Flexibility   Hamstrings 30 degrees shy of full extension on R, 22 degrees shy on L (90/90 SLR)      Palpation   Palpation comment T10-L5 CPA hypomobile; L4-S1 CPA painful; TTP to R paraspinals/ QL/ glutes      Special Tests   Lumbar Tests Slump Test;Straight Leg Raise;other;other2      Slump test   Findings Positive    Side Right      Straight Leg Raise   Findings Positive    Side  Right      other   Findings Positive    Side  Right    Comments ASLR      other   Findings Positive    Comment Repeated Lumbar extension reduces pt's symptoms  Objective measurements completed on  examination: See above findings.               PT Education - 04/26/21 0920     Education Details Pt educated about underlying physiology behind her LBP, as well as how targeted treatment may help to improve sxs. Pt also instructed on proper form with HEP.    Person(s) Educated Patient    Methods Explanation;Demonstration;Handout    Comprehension Verbalized understanding;Returned demonstration              PT Short Term Goals - 04/26/21 0928       PT SHORT TERM GOAL #1   Title Pt will report understanding and adherence to her HEP in order to promote independence in the management of her symptoms.    Baseline HEP given at baseline.    Time 4    Period Weeks    Status New    Target Date 05/24/21      PT SHORT TERM GOAL #2   Title Pt will report 50% decreased hip pain to demonstrate centralization of LBP.    Baseline Pt has 6/10 pain in low back and BIL posterior hips    Time 4    Period Weeks    Status New    Target Date 05/24/21               PT Long Term Goals - 04/26/21 0930       PT LONG TERM GOAL #1   Title Pt will achieve FOTO score of 72% or higher in order to demonstrate improved functional ability as it relates to her LBP.    Baseline 55% at initial eval    Time 8    Period Weeks    Status New    Target Date 06/21/21      PT LONG TERM GOAL #2   Title Pt will demonstrate lumbar flexion AROM of 65 degrees with 2/10 pain or less in order to get dressed without limitation.    Baseline 45 degrees with 6/10 pain    Time 8    Period Weeks    Status New    Target Date 06/21/21      PT LONG TERM GOAL #3   Title Pt will report ability to sit longer than 30 minutes with 2/10 pain or less in order to sit at her job without the need to stand for pain relief.    Baseline Pt unable to sit longer than 15 minutes without significant pain    Time 8    Period Weeks    Status New    Target Date 06/21/21      PT LONG TERM GOAL #4   Title Pt will  demonstrate BIL global hip strength of 5/5 in order to progress LE strengthening program without limitation.    Baseline BIL global hip strength ranging from 4-/5 to 4+/5    Time 8    Period Weeks    Status New    Target Date 06/21/21      PT LONG TERM GOAL #5   Title Pt will demonstrate WNL BIL lumbar rotation with 2/10 pain or less in order to turn when reaching into desk drawers at work without limitation.    Baseline R=68 degrees with 6/10 pain; L=80 degrees with 6/10 pain    Time 8    Period Weeks    Status New    Target Date 06/21/21  Plan - 04/26/21 [id number]     Clinical Impression Statement Pt is a pleasant 35yo F who presents with primary c/o chronic LBP with radiating pain into BIL posterior hips. Upon assessment, her primary impairments include weak BIL hip musculature in flexion, extension, and abduction, weak and pain R DF, hypomobile and painful lumbar passive accessories, tight BIL hamstrings, painful and limited lumbar AROM in all planes, TTP to R paraspinals/ QL/ glutes, and pain when ascending from a squat. Her sxs are most concordant with her established diagnosis of lumbar disc protrusion leading to radiculopathy. She will benefit from skilled PT to address her primary impairments and return to her prior level of function without limitation.    Examination-Activity Limitations Bathing;Dressing;Sit;Bed Mobility;Sleep;Squat;Lift;Bend    Examination-Participation Restrictions Occupation;Laundry;Cleaning;Driving    Stability/Clinical Decision Making Evolving/Moderate complexity    Clinical Decision Making Moderate    Rehab Potential Good    PT Frequency 2x / week    PT Duration 8 weeks    PT Treatment/Interventions Joint Manipulations;Spinal Manipulations;Passive range of motion;Dry needling;Taping;Manual techniques;Patient/family education;Neuromuscular re-education;Therapeutic exercise;Functional mobility training;Therapeutic  activities;Traction;Cryotherapy;Electrical Stimulation;Moist Heat;ADLs/Self Care Home Management;Aquatic Therapy;Biofeedback    PT Next Visit Plan Progress lumbar/ hip strengthening, introduce manual therapy vs. lumbar manipulation    PT Home Exercise Plan [id number]    Consulted and Agree with Plan of Care Patient             Patient will benefit from skilled therapeutic intervention in order to improve the following deficits and impairments:  Decreased strength, Impaired flexibility, Pain, Decreased range of motion, Decreased mobility, Hypomobility  Visit Diagnosis: Chronic bilateral low back pain with bilateral sciatica  Muscle weakness (generalized)     Problem List Patient Active Problem List   Diagnosis Date Noted   Left lumbar radiculopathy 10/24/2020   Arrest of descent, delivered, current hospitalization 06/27/2014   Cyst of ovary 11/01/2013   Atypical squamous cell changes of undetermined significance (ASCUS) on cervical cytology with positive high risk human papilloma virus (HPV) 05/03/2013   ECZEMA, ATOPIC DERMATITIS 12/10/2006    Carmelina DaneYarborough, Merideth Bosque, PT, DPT 04/26/21 9:39 AM   [facility] [address] [place], [place], [address] Phone: [phone]   Fax:  [phone]  Name: Susan Solomon MRN: [id number] Date of Birth: [birth year]

## 2021-04-26 NOTE — Patient Instructions (Signed)
  [id number] 

## 2021-04-30 ENCOUNTER — Other Ambulatory Visit: Payer: Self-pay

## 2021-04-30 ENCOUNTER — Ambulatory Visit: Payer: 59

## 2021-04-30 DIAGNOSIS — M5442 Lumbago with sciatica, left side: Secondary | ICD-10-CM

## 2021-04-30 DIAGNOSIS — G8929 Other chronic pain: Secondary | ICD-10-CM

## 2021-04-30 DIAGNOSIS — M6281 Muscle weakness (generalized): Secondary | ICD-10-CM

## 2021-04-30 NOTE — Patient Instructions (Signed)
  [id number]

## 2021-04-30 NOTE — Therapy (Signed)
[facility] Outpatient Rehabilitation [facility] [address] [place], [place], [address] Phone: [phone]   Fax:  [phone]  Physical Therapy Treatment  Patient Details  Name: Susan Solomon MRN: [id number] Date of Birth: 1986-03-20 Referring Provider (PT): Jadene Pierini   Encounter Date: 04/30/2021   PT End of Session - 04/30/21 1729     Visit Number 2    Number of Visits 17    Date for PT Re-Evaluation 06/21/21    Authorization Type UHC 2022, 60 VL, No auth required    PT Start Time 1645    PT Stop Time 1730    PT Time Calculation (min) 45 min    Activity Tolerance Patient tolerated treatment well    Behavior During Therapy [facility] for tasks assessed/performed             Past Medical History:  Diagnosis Date   Abnormal Pap smear 03/29/2013   ASC-US +HPV   Infection    UTI   Vaginal Pap smear, abnormal     Past Surgical History:  Procedure Laterality Date   CESAREAN SECTION N/A 06/26/2014   Procedure: CESAREAN SECTION;  Surgeon: Marlow Baars, MD;  Location: [facility];  Service: Obstetrics;  Laterality: N/A;   NO PAST SURGERIES      There were no vitals filed for this visit.   Subjective Assessment - 04/30/21 1656     Subjective Pt reports feeling stiff and in more pain than usual due to being at work all day. She reports being adherent to her initial HEP. She reports being ready to initiate today's treatment.    Currently in Pain? Yes    Pain Score 9     Pain Location Back    Pain Orientation Right;Left;Lower;Medial    Pain Descriptors / Indicators Burning;Aching    Pain Type Chronic pain                               [facility] Adult PT Treatment/Exercise - 04/30/21 0001       Lumbar Exercises: Standing   Other Standing Lumbar Exercises Pallof press at cable column 2x10 BIL 10#      Lumbar Exercises: Supine   Dead Bug --   2x10 BIL with green physioball   Bridge with [employer] 20 reps    Other Supine Lumbar  Exercises curl up heel tap 2x10 BIL      Lumbar Exercises: Sidelying   Other Sidelying Lumbar Exercises Side plank on knees with clamshell 2x8 BIL      Manual Therapy   Manual Therapy Joint mobilization;Soft tissue mobilization    Joint Mobilization Sidelying grade V lumbar manipulation x1 each side with cavitation    Soft tissue mobilization Efflourage to BIL lumbar paraspinals/ QL                    PT Education - 04/30/21 1729     Education Details Pt educated on proper form when performing her newly added exercises.    Person(s) Educated Patient    Methods Explanation;Demonstration;Handout    Comprehension Verbalized understanding;Returned demonstration              PT Short Term Goals - 04/26/21 0928       PT SHORT TERM GOAL #1   Title Pt will report understanding and adherence to her HEP in order to promote independence in the management of her symptoms.    Baseline HEP  given at baseline.    Time 4    Period Weeks    Status New    Target Date 05/24/21      PT SHORT TERM GOAL #2   Title Pt will report 50% decreased hip pain to demonstrate centralization of LBP.    Baseline Pt has 6/10 pain in low back and BIL posterior hips    Time 4    Period Weeks    Status New    Target Date 05/24/21               PT Long Term Goals - 04/26/21 0930       PT LONG TERM GOAL #1   Title Pt will achieve FOTO score of 72% or higher in order to demonstrate improved functional ability as it relates to her LBP.    Baseline 55% at initial eval    Time 8    Period Weeks    Status New    Target Date 06/21/21      PT LONG TERM GOAL #2   Title Pt will demonstrate lumbar flexion AROM of 65 degrees with 2/10 pain or less in order to get dressed without limitation.    Baseline 45 degrees with 6/10 pain    Time 8    Period Weeks    Status New    Target Date 06/21/21      PT LONG TERM GOAL #3   Title Pt will report ability to sit longer than 30 minutes with 2/10  pain or less in order to sit at her job without the need to stand for pain relief.    Baseline Pt unable to sit longer than 15 minutes without significant pain    Time 8    Period Weeks    Status New    Target Date 06/21/21      PT LONG TERM GOAL #4   Title Pt will demonstrate BIL global hip strength of 5/5 in order to progress LE strengthening program without limitation.    Baseline BIL global hip strength ranging from 4-/5 to 4+/5    Time 8    Period Weeks    Status New    Target Date 06/21/21      PT LONG TERM GOAL #5   Title Pt will demonstrate WNL BIL lumbar rotation with 2/10 pain or less in order to turn when reaching into desk drawers at work without limitation.    Baseline R=68 degrees with 6/10 pain; L=80 degrees with 6/10 pain    Time 8    Period Weeks    Status New    Target Date 06/21/21                   Plan - 04/30/21 1742     Clinical Impression Statement Pt responded well to all treatment today, demonstrating good form and no increase in pain with all new exercises. She reports improved pain from 9/10 to 7/10 following lumbar manipulation and demonstrates improved lumbar passive accessories post-manipulation. She also reports additional decrease in pain from 7/10 to 6/10 following efflourage to BIL lumbar paraspinals and QL. She will continue to benefit from skilled PT to address her primary impairments and return to her prior level of function without limitation.    Examination-Activity Limitations Bathing;Dressing;Sit;Bed Mobility;Sleep;Squat;Lift;Bend    Examination-Participation Restrictions Occupation;Laundry;Cleaning;Driving    Stability/Clinical Decision Making Evolving/Moderate complexity    Clinical Decision Making Moderate    Rehab Potential Good    PT Frequency  2x / week    PT Duration 8 weeks    PT Treatment/Interventions Joint Manipulations;Spinal Manipulations;Passive range of motion;Dry needling;Taping;Manual techniques;Patient/family  education;Neuromuscular re-education;Therapeutic exercise;Functional mobility training;Therapeutic activities;Traction;Cryotherapy;Electrical Stimulation;Moist Heat;ADLs/Self Care Home Management;Aquatic Therapy;Biofeedback    PT Next Visit Plan Progress lumbar/ hip strengthening, continue manual therapy vs. lumbar manipulation PRN    PT Home Exercise Plan [id number]    Consulted and Agree with Plan of Care Patient             Patient will benefit from skilled therapeutic intervention in order to improve the following deficits and impairments:  Decreased strength, Impaired flexibility, Pain, Decreased range of motion, Decreased mobility, Hypomobility  Visit Diagnosis: Chronic bilateral low back pain with bilateral sciatica  Muscle weakness (generalized)     Problem List Patient Active Problem List   Diagnosis Date Noted   Left lumbar radiculopathy 10/24/2020   Arrest of descent, delivered, current hospitalization 06/27/2014   Cyst of ovary 11/01/2013   Atypical squamous cell changes of undetermined significance (ASCUS) on cervical cytology with positive high risk human papilloma virus (HPV) 05/03/2013   ECZEMA, ATOPIC DERMATITIS 12/10/2006    Carmelina Dane, PT, DPT 04/30/21 5:45 PM   [facility] [address] [place], [place], [address] Phone: [phone]   Fax:  [phone]  Name: BRIYANA BADMAN MRN: [id number] Date of Birth: 12-Mar-1986

## 2021-05-03 ENCOUNTER — Encounter: Payer: Self-pay | Admitting: Physical Therapy

## 2021-05-03 ENCOUNTER — Ambulatory Visit: Payer: 59 | Admitting: Physical Therapy

## 2021-05-03 ENCOUNTER — Other Ambulatory Visit: Payer: Self-pay

## 2021-05-03 DIAGNOSIS — G8929 Other chronic pain: Secondary | ICD-10-CM

## 2021-05-03 DIAGNOSIS — M6281 Muscle weakness (generalized): Secondary | ICD-10-CM

## 2021-05-03 DIAGNOSIS — M5442 Lumbago with sciatica, left side: Secondary | ICD-10-CM

## 2021-05-03 NOTE — Therapy (Signed)
[facility] [address] [place], [place], [address] Phone: [phone]   Fax:  [phone]  Physical Therapy Treatment  Patient Details  Name: Susan Solomon MRN: [id number] Date of Birth: [birth year] Referring Provider (PT): Jadene Pierini   Encounter Date: 05/03/2021   PT End of Session - 05/03/21 1200     Visit Number 3    Number of Visits 17    Date for PT Re-Evaluation 06/21/21    Authorization Type UHC 2022, 60 VL, No auth required    PT Start Time 1150    PT Stop Time 1228    PT Time Calculation (min) 38 min    Activity Tolerance Patient tolerated treatment well    Behavior During Therapy [facility] for tasks assessed/performed             Past Medical History:  Diagnosis Date   Abnormal Pap smear 03/29/2013   ASC-US +HPV   Infection    UTI   Vaginal Pap smear, abnormal     Past Surgical History:  Procedure Laterality Date   CESAREAN SECTION N/A 06/26/2014   Procedure: CESAREAN SECTION;  Surgeon: Marlow Baars, MD;  Location: [facility];  Service: Obstetrics;  Laterality: N/A;   NO PAST SURGERIES      There were no vitals filed for this visit.   Subjective Assessment - 05/03/21 1151     Subjective Pt reports 6/10 constant pain and 8/10 with transitions and bending over.    Currently in Pain? Yes    Pain Score 8     Pain Location Back    Pain Orientation Right;Left;Lower    Pain Descriptors / Indicators Burning;Aching    Pain Type Chronic pain    Aggravating Factors  transitions, bending over    Pain Relieving Factors stretching backward and walking                               [facility] Adult PT Treatment/Exercise - 05/03/21 0001       Self-Care   Self-Care Other Self-Care Comments    Other Self-Care Comments  Use of bolster/ elevate legs for sleep, prone lying over pillows      Lumbar Exercises: Stretches   Single Knee to Chest Stretch 2 reps;30 seconds    Lower Trunk  Rotation 3 reps;30 seconds    Quadruped Mid Back Stretch Limitations childs pose stretch after prone lying   shown seated version for work     Lumbar Exercises: Standing   Other Standing Lumbar Exercises Pallof press at cable column 2x10 BIL 10#      Lumbar Exercises: Supine   Bridge with [employer] 10 reps   2 sets   Bridge with [employer] Limitations increased pain      Lumbar Exercises: Prone   Other Prone Lumbar Exercises Prone lying over 2 pillows - pt reports more comfort in low back    Other Prone Lumbar Exercises Gluteal squeze x 10 ,bent knee heel squeeze x 10 with ball, transverse abdominal draw in -hamstring curls x 10 each      Lumbar Exercises: Quadruped   [place]cat/Old Horse 10 reps                      PT Short Term Goals - 04/26/21 6834       PT SHORT TERM GOAL #1   Title Pt will report understanding and adherence to her  HEP in order to promote independence in the management of her symptoms.    Baseline HEP given at baseline.    Time 4    Period Weeks    Status New    Target Date 05/24/21      PT SHORT TERM GOAL #2   Title Pt will report 50% decreased hip pain to demonstrate centralization of LBP.    Baseline Pt has 6/10 pain in low back and BIL posterior hips    Time 4    Period Weeks    Status New    Target Date 05/24/21               PT Long Term Goals - 04/26/21 0930       PT LONG TERM GOAL #1   Title Pt will achieve FOTO score of 72% or higher in order to demonstrate improved functional ability as it relates to her LBP.    Baseline 55% at initial eval    Time 8    Period Weeks    Status New    Target Date 06/21/21      PT LONG TERM GOAL #2   Title Pt will demonstrate lumbar flexion AROM of 65 degrees with 2/10 pain or less in order to get dressed without limitation.    Baseline 45 degrees with 6/10 pain    Time 8    Period Weeks    Status New    Target Date 06/21/21      PT LONG TERM GOAL #3   Title Pt will report  ability to sit longer than 30 minutes with 2/10 pain or less in order to sit at her job without the need to stand for pain relief.    Baseline Pt unable to sit longer than 15 minutes without significant pain    Time 8    Period Weeks    Status New    Target Date 06/21/21      PT LONG TERM GOAL #4   Title Pt will demonstrate BIL global hip strength of 5/5 in order to progress LE strengthening program without limitation.    Baseline BIL global hip strength ranging from 4-/5 to 4+/5    Time 8    Period Weeks    Status New    Target Date 06/21/21      PT LONG TERM GOAL #5   Title Pt will demonstrate WNL BIL lumbar rotation with 2/10 pain or less in order to turn when reaching into desk drawers at work without limitation.    Baseline R=68 degrees with 6/10 pain; L=80 degrees with 6/10 pain    Time 8    Period Weeks    Status New    Target Date 06/21/21                   Plan - 05/03/21 1228     Clinical Impression Statement Pt reports 6-8/10 pain today with increased pain during transitions. She reports she is unble to get much sleep. Reviewed sleep positions and pt found comfort with hooklying using tall bolster for LE elevation as well as prone lying over 2 pillows. She was able to complete gentle prone core activation exercises but reports pain with all transverse abdominal /pelvic fllor contractions. This causes pain to gradully increased in bilateral lower back. After prone lying, pt was instructed in childs pose and cat/camel which she reported felt good on her lower back. Also reviewed hip hinge using dowel for feedback. She  was able to return demonstrate correctly. After session, pt reported her back felt better than when she came in.    PT Next Visit Plan Progress lumbar/ hip strengthening, continue manual therapy vs. lumbar manipulation PRN    PT Home Exercise Plan [id number]             Patient will benefit from skilled therapeutic intervention in order to improve  the following deficits and impairments:  Decreased strength, Impaired flexibility, Pain, Decreased range of motion, Decreased mobility, Hypomobility  Visit Diagnosis: Chronic bilateral low back pain with bilateral sciatica  Muscle weakness (generalized)     Problem List Patient Active Problem List   Diagnosis Date Noted   Left lumbar radiculopathy 10/24/2020   Arrest of descent, delivered, current hospitalization 06/27/2014   Cyst of ovary 11/01/2013   Atypical squamous cell changes of undetermined significance (ASCUS) on cervical cytology with positive high risk human papilloma virus (HPV) 05/03/2013   ECZEMA, ATOPIC DERMATITIS 12/10/2006    Susan Solomon, PTA 05/03/2021, 12:35 PM  [facility] [address] [place], [place], [address] Phone: [phone]   Fax:  [phone]  Name: Susan Solomon MRN: [id number] Date of Birth: 1986-08-21

## 2021-05-07 ENCOUNTER — Ambulatory Visit: Payer: 59

## 2021-05-07 ENCOUNTER — Other Ambulatory Visit: Payer: Self-pay

## 2021-05-07 DIAGNOSIS — M5442 Lumbago with sciatica, left side: Secondary | ICD-10-CM | POA: Diagnosis not present

## 2021-05-07 DIAGNOSIS — G8929 Other chronic pain: Secondary | ICD-10-CM

## 2021-05-07 DIAGNOSIS — M6281 Muscle weakness (generalized): Secondary | ICD-10-CM

## 2021-05-07 NOTE — Patient Instructions (Signed)
  [id number] 

## 2021-05-07 NOTE — Therapy (Signed)
[facility] Out[facility] Rehabilitation [facility] [address] [place], [place], [address] Phone: [phone]   Fax:  [phone]  Physical Therapy Treatment  Patient Details  Name: Susan Solomon MRN: [id number] Date of Birth: [birth year] Referring Provider (PT): Jadene Pierini   Encounter Date: 05/07/2021   PT End of Session - 05/07/21 1628     Visit Number 4    Number of Visits 17    Date for PT Re-Evaluation 06/21/21    Authorization Type UHC 2022, 60 VL, No auth required    PT Start Time 1615    PT Stop Time 1700    PT Time Calculation (min) 45 min    Activity Tolerance Patient tolerated treatment well    Behavior During Therapy [facility] for tasks assessed/performed             Past Medical History:  Diagnosis Date   Abnormal Pap smear 03/29/2013   ASC-US +HPV   Infection    UTI   Vaginal Pap smear, abnormal     Past Surgical History:  Procedure Laterality Date   CESAREAN SECTION N/A 06/26/2014   Procedure: CESAREAN SECTION;  Surgeon: Marlow Baars, MD;  Location: [facility];  Service: Obstetrics;  Laterality: N/A;   NO PAST SURGERIES      There were no vitals filed for this visit.   Subjective Assessment - 05/07/21 1617     Subjective Pt reports 7.5/10 pain today. She states she was feeling fine all day until about an hour before her appointment. She attributes her increased pain to sitting for most of the day.    Currently in Pain? Yes    Pain Score 8     Pain Location Back    Pain Orientation Right;Left;Lower    Pain Descriptors / Indicators Aching;Burning    Pain Type Chronic pain                               [facility] Adult PT Treatment/Exercise - 05/07/21 0001       Lumbar Exercises: Stretches   Quadruped Mid Back Stretch Other (comment)   Prone press up 2x10     Lumbar Exercises: Standing   Other Standing Lumbar Exercises Pallof press with walkout and concentric trunk rotation at end of walkout at cable column  7#      Lumbar Exercises: Supine   Other Supine Lumbar Exercises Hip thrusts on edge of table with 10# weight 2x10      Lumbar Exercises: Prone   Other Prone Lumbar Exercises Standard plank 3x to exhaustion    Other Prone Lumbar Exercises Prone press-up 2x10      Manual Therapy   Manual Therapy Joint mobilization;Soft tissue mobilization    Joint Mobilization Sidelying grade V lumbar manipulation x1 each side with cavitation; grade 3 AP glides to L4-L5    Soft tissue mobilization Efflourage to BIL lumbar paraspinals/ QL                    PT Education - 05/07/21 1628     Education Details Pt educated on proper form when performing her newly added exercises.    Person(s) Educated Patient    Methods Explanation;Demonstration;Handout    Comprehension Returned demonstration;Verbalized understanding              PT Short Term Goals - 04/26/21 0928       PT SHORT TERM GOAL #1   Title Pt  will report understanding and adherence to her HEP in order to promote independence in the management of her symptoms.    Baseline HEP given at baseline.    Time 4    Period Weeks    Status New    Target Date 05/24/21      PT SHORT TERM GOAL #2   Title Pt will report 50% decreased hip pain to demonstrate centralization of LBP.    Baseline Pt has 6/10 pain in low back and BIL posterior hips    Time 4    Period Weeks    Status New    Target Date 05/24/21               PT Long Term Goals - 04/26/21 0930       PT LONG TERM GOAL #1   Title Pt will achieve FOTO score of 72% or higher in order to demonstrate improved functional ability as it relates to her LBP.    Baseline 55% at initial eval    Time 8    Period Weeks    Status New    Target Date 06/21/21      PT LONG TERM GOAL #2   Title Pt will demonstrate lumbar flexion AROM of 65 degrees with 2/10 pain or less in order to get dressed without limitation.    Baseline 45 degrees with 6/10 pain    Time 8    Period  Weeks    Status New    Target Date 06/21/21      PT LONG TERM GOAL #3   Title Pt will report ability to sit longer than 30 minutes with 2/10 pain or less in order to sit at her job without the need to stand for pain relief.    Baseline Pt unable to sit longer than 15 minutes without significant pain    Time 8    Period Weeks    Status New    Target Date 06/21/21      PT LONG TERM GOAL #4   Title Pt will demonstrate BIL global hip strength of 5/5 in order to progress LE strengthening program without limitation.    Baseline BIL global hip strength ranging from 4-/5 to 4+/5    Time 8    Period Weeks    Status New    Target Date 06/21/21      PT LONG TERM GOAL #5   Title Pt will demonstrate WNL BIL lumbar rotation with 2/10 pain or less in order to turn when reaching into desk drawers at work without limitation.    Baseline R=68 degrees with 6/10 pain; L=80 degrees with 6/10 pain    Time 8    Period Weeks    Status New    Target Date 06/21/21                   Plan - 05/07/21 1656     Clinical Impression Statement Pt responded well to all treatment today, demonstrating proper form with all exercises. She also reports decrease in pain from 7.5/10 to 5/10 following lumbar manipulation and from 5/10 to 4/10 following joint mobilization and efflourage to end the session. She will continue to benefit from skilled PT to address her primary impairments and return to her prior level of function without limitation.    Examination-Activity Limitations Bathing;Dressing;Sit;Bed Mobility;Sleep;Squat;Lift;Bend    Examination-Participation Restrictions Occupation;Laundry;Cleaning;Driving    Stability/Clinical Decision Making Evolving/Moderate complexity    Clinical Decision Making Moderate  Rehab Potential Good    PT Frequency 2x / week    PT Duration 8 weeks    PT Treatment/Interventions Joint Manipulations;Spinal Manipulations;Passive range of motion;Dry needling;Taping;Manual  techniques;Patient/family education;Neuromuscular re-education;Therapeutic exercise;Functional mobility training;Therapeutic activities;Traction;Cryotherapy;Electrical Stimulation;Moist Heat;ADLs/Self Care Home Management;Aquatic Therapy;Biofeedback    PT Next Visit Plan Progress lumbar/ hip strengthening, continue manual therapy vs. lumbar manipulation PRN    PT Home Exercise Plan [email or web address]    Consulted and Agree with Plan of Care Patient             Patient will benefit from skilled therapeutic intervention in order to improve the following deficits and impairments:  Decreased strength, Impaired flexibility, Pain, Decreased range of motion, Decreased mobility, Hypomobility  Visit Diagnosis: Chronic bilateral low back pain with bilateral sciatica  Muscle weakness (generalized)     Problem List Patient Active Problem List   Diagnosis Date Noted   Left lumbar radiculopathy 10/24/2020   Arrest of descent, delivered, current hospitalization 06/27/2014   Cyst of ovary 11/01/2013   Atypical squamous cell changes of undetermined significance (ASCUS) on cervical cytology with positive high risk human papilloma virus (HPV) 05/03/2013   ECZEMA, ATOPIC DERMATITIS 12/10/2006    Carmelina Dane, PT, DPT 05/07/21 4:58 PM   [facility] [address] [place], [place], [address] Phone: [phone]   Fax:  [phone]  Name: Susan Solomon MRN: [id number] Date of Birth: 01-23-1986

## 2021-05-10 ENCOUNTER — Other Ambulatory Visit: Payer: Self-pay

## 2021-05-10 ENCOUNTER — Ambulatory Visit: Payer: 59 | Admitting: Physical Therapy

## 2021-05-10 ENCOUNTER — Encounter: Payer: Self-pay | Admitting: Physical Therapy

## 2021-05-10 DIAGNOSIS — G8929 Other chronic pain: Secondary | ICD-10-CM

## 2021-05-10 DIAGNOSIS — M6281 Muscle weakness (generalized): Secondary | ICD-10-CM

## 2021-05-10 DIAGNOSIS — M5442 Lumbago with sciatica, left side: Secondary | ICD-10-CM | POA: Diagnosis not present

## 2021-05-10 NOTE — Therapy (Addendum)
[facility] Outpatient Rehabilitation [facility] [address] [place], [place], [address] Phone: [phone]   Fax:  [phone]  Physical Therapy Treatment  Patient Details  Name: Susan Solomon MRN: [id number] Date of Birth: 12/09/1985 Referring Provider (PT): Jadene Pierini   Encounter Date: 05/10/2021   PT End of Session - 05/10/21 1238     Visit Number 5    Number of Visits 17    Date for PT Re-Evaluation 06/21/21    Authorization Type UHC 2022, 60 VL, No auth required    PT Start Time 1232    PT Stop Time 1315    PT Time Calculation (min) 43 min             Past Medical History:  Diagnosis Date   Abnormal Pap smear 03/29/2013   ASC-US +HPV   Infection    UTI   Vaginal Pap smear, abnormal     Past Surgical History:  Procedure Laterality Date   CESAREAN SECTION N/A 06/26/2014   Procedure: CESAREAN SECTION;  Surgeon: Marlow Baars, MD;  Location: [facility];  Service: Obstetrics;  Laterality: N/A;   NO PAST SURGERIES      There were no vitals filed for this visit.   Subjective Assessment - 05/10/21 1236     Subjective I had 9/10 pain this mornig after sleeping on my stomach. Pain is 7.5/10 now after being at work.    Currently in Pain? Yes    Pain Score 7     Pain Location Back    Pain Orientation Lower;Right;Left    Pain Descriptors / Indicators Aching;Burning    Pain Type Chronic pain    Aggravating Factors  transitions/bending over    Pain Relieving Factors stretching backwards                               [facility] Adult PT Treatment/Exercise - 05/10/21 0001       Lumbar Exercises: Stretches   Single Knee to Chest Stretch 2 reps;30 seconds    Lower Trunk Rotation 3 reps;30 seconds    Quadruped Mid Back Stretch Limitations childs pose -rocking into quadruped and cat/camel x 10      Lumbar Exercises: Standing   Wall Slides 10 reps    Wall Slides Limitations with PPT    Other Standing Lumbar Exercises Pallof  press with concentric trunk rotation at end of walkout at cable column 7#      Lumbar Exercises: Seated   Other Seated Lumbar Exercises sit-stands focusing on hip hinge and neutral spine- she tends to over extend      Lumbar Exercises: Supine   Pelvic Tilt 10 reps    Pelvic Tilt Limitations min cues for technique    Clam Limitations bent knee fall outs with PPT    Other Supine Lumbar Exercises Hip thrusts on edge of table with no weight x10      Lumbar Exercises: Prone   Other Prone Lumbar Exercises multifidi activation 5 sec x 10- less panful after trigger point release      Manual Therapy   Soft tissue mobilization Trigger point release to lumbar paraspinals                      PT Short Term Goals - 04/26/21 1607       PT SHORT TERM GOAL #1   Title Pt will report understanding and adherence to her HEP in  order to promote independence in the management of her symptoms.    Baseline HEP given at baseline.    Time 4    Period Weeks    Status New    Target Date 05/24/21      PT SHORT TERM GOAL #2   Title Pt will report 50% decreased hip pain to demonstrate centralization of LBP.    Baseline Pt has 6/10 pain in low back and BIL posterior hips    Time 4    Period Weeks    Status New    Target Date 05/24/21               PT Long Term Goals - 04/26/21 0930       PT LONG TERM GOAL #1   Title Pt will achieve FOTO score of 72% or higher in order to demonstrate improved functional ability as it relates to her LBP.    Baseline 55% at initial eval    Time 8    Period Weeks    Status New    Target Date 06/21/21      PT LONG TERM GOAL #2   Title Pt will demonstrate lumbar flexion AROM of 65 degrees with 2/10 pain or less in order to get dressed without limitation.    Baseline 45 degrees with 6/10 pain    Time 8    Period Weeks    Status New    Target Date 06/21/21      PT LONG TERM GOAL #3   Title Pt will report ability to sit longer than 30 minutes with  2/10 pain or less in order to sit at her job without the need to stand for pain relief.    Baseline Pt unable to sit longer than 15 minutes without significant pain    Time 8    Period Weeks    Status New    Target Date 06/21/21      PT LONG TERM GOAL #4   Title Pt will demonstrate BIL global hip strength of 5/5 in order to progress LE strengthening program without limitation.    Baseline BIL global hip strength ranging from 4-/5 to 4+/5    Time 8    Period Weeks    Status New    Target Date 06/21/21      PT LONG TERM GOAL #5   Title Pt will demonstrate WNL BIL lumbar rotation with 2/10 pain or less in order to turn when reaching into desk drawers at work without limitation.    Baseline R=68 degrees with 6/10 pain; L=80 degrees with 6/10 pain    Time 8    Period Weeks    Status New    Target Date 06/21/21                   Plan - 05/10/21 1323     Clinical Impression Statement Pt continues to have low back pain with transverse abdominal/multifidus activation. Trigger point release performed along lumbar paraspinals and then patient could activate core without as much lumbar pain. On arrival today she was in pain with transitions and reported she just came from work which makes her stiff. Worked on trunk/lumbar mobility and core strengthening. REviewed hip hinge and she needs cues to decrease her lumbar lordosis. She reported feeling looser at end of session.    PT Next Visit Plan Progress lumbar/ hip strengthening, continue manual therapy vs. lumbar manipulation PRN    PT Home Exercise Plan [id number]  Patient will benefit from skilled therapeutic intervention in order to improve the following deficits and impairments:  Decreased strength, Impaired flexibility, Pain, Decreased range of motion, Decreased mobility, Hypomobility  Visit Diagnosis: Chronic bilateral low back pain with bilateral sciatica  Muscle weakness (generalized)     Problem  List Patient Active Problem List   Diagnosis Date Noted   Left lumbar radiculopathy 10/24/2020   Arrest of descent, delivered, current hospitalization 06/27/2014   Cyst of ovary 11/01/2013   Atypical squamous cell changes of undetermined significance (ASCUS) on cervical cytology with positive high risk human papilloma virus (HPV) 05/03/2013   ECZEMA, ATOPIC DERMATITIS 12/10/2006    Sherrie Mustache, PTA 05/10/2021, 1:35 PM  [facility] [address] [place], [place], [address] Phone: [phone]   Fax:  [phone]  Name: CAREN GARSKE MRN: [id number] Date of Birth: 03-18-1986

## 2021-05-14 ENCOUNTER — Ambulatory Visit: Payer: 59 | Attending: Neurological Surgery

## 2021-05-14 ENCOUNTER — Other Ambulatory Visit: Payer: Self-pay

## 2021-05-14 DIAGNOSIS — G8929 Other chronic pain: Secondary | ICD-10-CM | POA: Diagnosis present

## 2021-05-14 DIAGNOSIS — M6281 Muscle weakness (generalized): Secondary | ICD-10-CM | POA: Insufficient documentation

## 2021-05-14 DIAGNOSIS — M5441 Lumbago with sciatica, right side: Secondary | ICD-10-CM | POA: Diagnosis present

## 2021-05-14 DIAGNOSIS — M5442 Lumbago with sciatica, left side: Secondary | ICD-10-CM | POA: Insufficient documentation

## 2021-05-14 NOTE — Patient Instructions (Signed)
  [id number] 

## 2021-05-14 NOTE — Therapy (Signed)
[facility] [address] [place], [place], [address] Phone: [phone]   Fax:  [phone]  Physical Therapy Treatment  Patient Details  Name: Susan Solomon MRN: [id number] Date of Birth: 18-Dec-1985 Referring Provider (PT): Jadene Pierini   Encounter Date: 05/14/2021   PT End of Session - 05/14/21 1649     Visit Number 6    Number of Visits 17    Date for PT Re-Evaluation 06/21/21    Authorization Type UHC 2022, 60 VL, No auth required    PT Start Time 1625    PT Stop Time 1700    PT Time Calculation (min) 35 min    Activity Tolerance Patient tolerated treatment well    Behavior During Therapy [facility] for tasks assessed/performed             Past Medical History:  Diagnosis Date   Abnormal Pap smear 03/29/2013   ASC-US +HPV   Infection    UTI   Vaginal Pap smear, abnormal     Past Surgical History:  Procedure Laterality Date   CESAREAN SECTION N/A 06/26/2014   Procedure: CESAREAN SECTION;  Surgeon: Marlow Baars, MD;  Location: [facility];  Service: Obstetrics;  Laterality: N/A;   NO PAST SURGERIES      There were no vitals filed for this visit.   Subjective Assessment - 05/14/21 1625     Subjective Pt reports doing "surprisingly well" the past week, with 3.5/10 pain today. She states she has been sleeping better, as well since switching her knee pillow with a new one. She adds that her HEP continues to go well with no increase in pain with her exercises.    Limitations Sitting;Lifting;Other (comment)    How long can you sit comfortably? 15 minutes    How long can you stand comfortably? Unlimited    How long can you walk comfortably? Unlimited    Diagnostic tests 11/09/2020 MRI Lumbar without contrast: IMPRESSION:  1. Focal right paracentral disc protrusion at L5-S1 resulting in  mass effect upon the descending right S1 nerve root. Correlate for  radicular symptoms in this distribution.  2. Minimal diffuse disc  bulge at L4-L5.  3. No canal or foraminal stenosis at any level.  4. Remaining lumbar intervertebral disc levels are within normal  limits.    Patient Stated Goals Sleep    Currently in Pain? Yes    Pain Score 3     Pain Location Back    Pain Orientation Right;Left;Lower    Pain Descriptors / Indicators Aching;Burning    Pain Type Chronic pain    Pain Onset More than a month ago    Pain Frequency Constant                [facility] PT Assessment - 05/14/21 0001       Observation/Other Assessments   Focus on Therapeutic Outcomes (FOTO)  63%                           [facility] Adult PT Treatment/Exercise - 05/14/21 0001       Lumbar Exercises: Standing   Other Standing Lumbar Exercises Mini squat side steps 3x51ft BIL    Other Standing Lumbar Exercises Tall-kneeling hip thrust with knees on Airex pad at cable column 3x10 with 23#      Lumbar Exercises: Sidelying   Other Sidelying Lumbar Exercises Side plank with hip abduction 2x8 BIL  Manual Therapy   Manual Therapy Soft tissue mobilization    Soft tissue mobilization Efflourage/ MTPR to BIL lumbar paraspinals/ QL                    PT Education - 05/14/21 1648     Education Details Pt educated on proper form when performing her newly added exercises.    Person(s) Educated Patient    Methods Explanation;Demonstration;Handout    Comprehension Verbalized understanding;Returned demonstration              PT Short Term Goals - 05/14/21 1659       PT SHORT TERM GOAL #1   Title Pt will report understanding and adherence to her HEP in order to promote independence in the management of her symptoms.    Baseline Pt reports regular adherence to her HEP    Time 4    Period Weeks    Status Achieved    Target Date 05/24/21      PT SHORT TERM GOAL #2   Title Pt will report 50% decreased hip pain to demonstrate centralization of LBP.    Baseline Pt reports 50% improvement of overall pain with no  radicular symptoms over the past 2 weeks.    Time 4    Period Weeks    Status Achieved    Target Date 05/24/21               PT Long Term Goals - 05/14/21 1700       PT LONG TERM GOAL #1   Title Pt will achieve FOTO score of 72% or higher in order to demonstrate improved functional ability as it relates to her LBP.    Baseline 55% at initial eval, 63% on 05/14/2021    Time 8    Period Weeks    Status On-going      PT LONG TERM GOAL #2   Title Pt will demonstrate lumbar flexion AROM of 65 degrees with 2/10 pain or less in order to get dressed without limitation.    Baseline 45 degrees with 6/10 pain    Time 8    Period Weeks    Status New      PT LONG TERM GOAL #3   Title Pt will report ability to sit longer than 30 minutes with 2/10 pain or less in order to sit at her job without the need to stand for pain relief.    Baseline Pt unable to sit longer than 15 minutes without significant pain    Time 8    Period Weeks    Status New      PT LONG TERM GOAL #4   Title Pt will demonstrate BIL global hip strength of 5/5 in order to progress LE strengthening program without limitation.    Baseline BIL global hip strength ranging from 4-/5 to 4+/5    Time 8    Period Weeks    Status New      PT LONG TERM GOAL #5   Title Pt will demonstrate WNL BIL lumbar rotation with 2/10 pain or less in order to turn when reaching into desk drawers at work without limitation.    Baseline R=68 degrees with 6/10 pain; L=80 degrees with 6/10 pain    Time 8    Period Weeks    Status New                   Plan - 05/14/21 1649  Clinical Impression Statement Pt reports to clinic 10 minutes late to her appointment, leading to a truncated session. She responded well to all treatment today, demonstrating proper form and no increase in pain with all new exercises. She reports a therapeutic response to manual therapy with an in-session decrease in pain from 3.5/10 to 2/10. She will  continue to benefit from skilled PT to address her primary impairments and return to her prior level of function without limitation.    Examination-Activity Limitations Bathing;Dressing;Sit;Bed Mobility;Sleep;Squat;Lift;Bend    Examination-Participation Restrictions Occupation;Laundry;Cleaning;Driving    Stability/Clinical Decision Making Evolving/Moderate complexity    Clinical Decision Making Moderate    Rehab Potential Good    PT Frequency 2x / week    PT Duration 8 weeks    PT Treatment/Interventions Joint Manipulations;Spinal Manipulations;Passive range of motion;Dry needling;Taping;Manual techniques;Patient/family education;Neuromuscular re-education;Therapeutic exercise;Functional mobility training;Therapeutic activities;Traction;Cryotherapy;Electrical Stimulation;Moist Heat;ADLs/Self Care Home Management;Aquatic Therapy;Biofeedback    PT Next Visit Plan Progress lumbar/ hip strengthening, continue manual therapy vs. lumbar manipulation PRN    PT Home Exercise Plan [id number]    Consulted and Agree with Plan of Care Patient             Patient will benefit from skilled therapeutic intervention in order to improve the following deficits and impairments:  Decreased strength, Impaired flexibility, Pain, Decreased range of motion, Decreased mobility, Hypomobility  Visit Diagnosis: Chronic bilateral low back pain with bilateral sciatica  Muscle weakness (generalized)     Problem List Patient Active Problem List   Diagnosis Date Noted   Left lumbar radiculopathy 10/24/2020   Arrest of descent, delivered, current hospitalization 06/27/2014   Cyst of ovary 11/01/2013   Atypical squamous cell changes of undetermined significance (ASCUS) on cervical cytology with positive high risk human papilloma virus (HPV) 05/03/2013   ECZEMA, ATOPIC DERMATITIS 12/10/2006    Carmelina Dane, PT, DPT 05/14/21 5:02 PM   [facility] [address] [place], [place], [address] Phone: [phone]   Fax:  [phone]  Name: NYRIE SIGAL MRN: [id number] Date of Birth: [birth year]

## 2021-05-21 ENCOUNTER — Other Ambulatory Visit: Payer: Self-pay

## 2021-05-21 ENCOUNTER — Ambulatory Visit: Payer: 59

## 2021-05-21 DIAGNOSIS — M6281 Muscle weakness (generalized): Secondary | ICD-10-CM

## 2021-05-21 DIAGNOSIS — G8929 Other chronic pain: Secondary | ICD-10-CM

## 2021-05-21 DIAGNOSIS — M5442 Lumbago with sciatica, left side: Secondary | ICD-10-CM | POA: Diagnosis not present

## 2021-05-21 NOTE — Therapy (Signed)
[facility] Rehabilitation [facility] [address] [place], [place], [address] Phone: [phone]   Fax:  [phone]  Physical Therapy Treatment  Patient Details  Name: Susan Solomon MRN: [id number] Date of Birth: May 05, 1986 Referring Provider (PT): Jadene Pierini   Encounter Date: 05/21/2021   PT End of Session - 05/21/21 1641     Visit Number 7    Number of Visits 17    Date for PT Re-Evaluation 06/21/21    Authorization Type UHC 2022, 60 VL, No auth required    PT Start Time 1615    PT Stop Time 1700    PT Time Calculation (min) 45 min    Activity Tolerance Patient tolerated treatment well    Behavior During Therapy [facility] for tasks assessed/performed             Past Medical History:  Diagnosis Date   Abnormal Pap smear 03/29/2013   ASC-US +HPV   Infection    UTI   Vaginal Pap smear, abnormal     Past Surgical History:  Procedure Laterality Date   CESAREAN SECTION N/A 06/26/2014   Procedure: CESAREAN SECTION;  Surgeon: Marlow Baars, MD;  Location: [facility];  Service: Obstetrics;  Laterality: N/A;   NO PAST SURGERIES      There were no vitals filed for this visit.   Subjective Assessment - 05/21/21 1616     Subjective Pt reports with "manageable" pain today rated at a 4-5/10. She reports that she is very tired after a full day at work, but reports being ready for today's session. She adds that she has been adherent to her HEP, performing her exercises daily.    Currently in Pain? Yes    Pain Score 5     Pain Location Back    Pain Orientation Right;Left;Lower    Pain Descriptors / Indicators Aching    Pain Type Chronic pain                               [facility] Adult PT Treatment/Exercise - 05/21/21 0001       Lumbar Exercises: Stretches   Other Lumbar Stretch Exercise Cobra stretch 2x30sec      Lumbar Exercises: Standing   Other Standing Lumbar Exercises Hip abduction at cybex machine 25# 2x10 BIL     Other Standing Lumbar Exercises Hip extension 2x10 BIL at Cybex machine 37.5#      Lumbar Exercises: Seated   Other Seated Lumbar Exercises --      Lumbar Exercises: Supine   Other Supine Lumbar Exercises Cybex leg press 3x8 at 120#      Lumbar Exercises: Sidelying   Other Sidelying Lumbar Exercises Side bend with hip on Bosu ball 2x8 BIL      Lumbar Exercises: Prone   Other Prone Lumbar Exercises Standard plank 3x to exhaustion    Other Prone Lumbar Exercises Lumbar extension with hips on Bosu ball 2x8      Modalities   Modalities Moist Heat      Moist Heat Therapy   Number Minutes Moist Heat 10 Minutes    Moist Heat Location Lumbar Spine                    PT Education - 05/21/21 1641     Education Details Pt educated on proper form with in-clinic exercises    Person(s) Educated Patient    Methods Explanation;Demonstration    Comprehension  Verbalized understanding;Returned demonstration              PT Short Term Goals - 05/14/21 1659       PT SHORT TERM GOAL #1   Title Pt will report understanding and adherence to her HEP in order to promote independence in the management of her symptoms.    Baseline Pt reports regular adherence to her HEP    Time 4    Period Weeks    Status Achieved    Target Date 05/24/21      PT SHORT TERM GOAL #2   Title Pt will report 50% decreased hip pain to demonstrate centralization of LBP.    Baseline Pt reports 50% improvement of overall pain with no radicular symptoms over the past 2 weeks.    Time 4    Period Weeks    Status Achieved    Target Date 05/24/21               PT Long Term Goals - 05/14/21 1700       PT LONG TERM GOAL #1   Title Pt will achieve FOTO score of 72% or higher in order to demonstrate improved functional ability as it relates to her LBP.    Baseline 55% at initial eval, 63% on 05/14/2021    Time 8    Period Weeks    Status On-going      PT LONG TERM GOAL #2   Title Pt will  demonstrate lumbar flexion AROM of 65 degrees with 2/10 pain or less in order to get dressed without limitation.    Baseline 45 degrees with 6/10 pain    Time 8    Period Weeks    Status New      PT LONG TERM GOAL #3   Title Pt will report ability to sit longer than 30 minutes with 2/10 pain or less in order to sit at her job without the need to stand for pain relief.    Baseline Pt unable to sit longer than 15 minutes without significant pain    Time 8    Period Weeks    Status New      PT LONG TERM GOAL #4   Title Pt will demonstrate BIL global hip strength of 5/5 in order to progress LE strengthening program without limitation.    Baseline BIL global hip strength ranging from 4-/5 to 4+/5    Time 8    Period Weeks    Status New      PT LONG TERM GOAL #5   Title Pt will demonstrate WNL BIL lumbar rotation with 2/10 pain or less in order to turn when reaching into desk drawers at work without limitation.    Baseline R=68 degrees with 6/10 pain; L=80 degrees with 6/10 pain    Time 8    Period Weeks    Status New                   Plan - 05/21/21 1642     Clinical Impression Statement Pt responded well to all treatment today, demonstrating proper form and no increase in pain with all new exercises. She reports decrease in pain from 4.5/10 to 3/10 following moist heat. She will continue to benefit from skilled PT to address her primary impairments and return to her prior level of function without limitation.    Examination-Activity Limitations Bathing;Dressing;Sit;Bed Mobility;Sleep;Squat;Lift;Bend    Examination-Participation Restrictions Occupation;Laundry;Cleaning;Driving    Stability/Clinical Decision Making Evolving/Moderate complexity  Clinical Decision Making Moderate    Rehab Potential Good    PT Frequency 2x / week    PT Duration 8 weeks    PT Treatment/Interventions Joint Manipulations;Spinal Manipulations;Passive range of motion;Dry needling;Taping;Manual  techniques;Patient/family education;Neuromuscular re-education;Therapeutic exercise;Functional mobility training;Therapeutic activities;Traction;Cryotherapy;Electrical Stimulation;Moist Heat;ADLs/Self Care Home Management;Aquatic Therapy;Biofeedback    PT Next Visit Plan Progress lumbar/ hip strengthening, continue manual therapy vs. lumbar manipulation PRN    PT Home Exercise Plan [id number]    Consulted and Agree with Plan of Care Patient             Patient will benefit from skilled therapeutic intervention in order to improve the following deficits and impairments:  Decreased strength, Impaired flexibility, Pain, Decreased range of motion, Decreased mobility, Hypomobility  Visit Diagnosis: Chronic bilateral low back pain with bilateral sciatica  Muscle weakness (generalized)     Problem List Patient Active Problem List   Diagnosis Date Noted   Left lumbar radiculopathy 10/24/2020   Arrest of descent, delivered, current hospitalization 06/27/2014   Cyst of ovary 11/01/2013   Atypical squamous cell changes of undetermined significance (ASCUS) on cervical cytology with positive high risk human papilloma virus (HPV) 05/03/2013   ECZEMA, ATOPIC DERMATITIS 12/10/2006    Carmelina Dane, PT, DPT 05/21/21 4:47 PM   [facility] [address] [place], [place], [address] Phone: [phone]   Fax:  [phone]  Name: Susan Solomon MRN: [id number] Date of Birth: [birth year]

## 2021-05-21 NOTE — Patient Instructions (Signed)
Reviewed pt's HEP with focus on proper form

## 2021-05-24 ENCOUNTER — Other Ambulatory Visit: Payer: Self-pay

## 2021-05-24 ENCOUNTER — Ambulatory Visit: Payer: 59

## 2021-05-24 DIAGNOSIS — M6281 Muscle weakness (generalized): Secondary | ICD-10-CM

## 2021-05-24 DIAGNOSIS — G8929 Other chronic pain: Secondary | ICD-10-CM

## 2021-05-24 DIAGNOSIS — M5442 Lumbago with sciatica, left side: Secondary | ICD-10-CM | POA: Diagnosis not present

## 2021-05-24 DIAGNOSIS — M5441 Lumbago with sciatica, right side: Secondary | ICD-10-CM

## 2021-05-24 NOTE — Patient Instructions (Signed)
  [id number] 

## 2021-05-24 NOTE — Therapy (Signed)
[facility] [address] [place], [place], [address] Phone: [phone]   Fax:  [phone]  Physical Therapy Treatment  Patient Details  Name: Susan Solomon MRN: [id number] Date of Birth: October 25, 1985 Referring Provider (PT): Jadene Pierini   Encounter Date: 05/24/2021   PT End of Session - 05/24/21 1248     Visit Number 8    Number of Visits 17    Date for PT Re-Evaluation 06/21/21    Authorization Type UHC 2022, 60 VL, No auth required    PT Start Time 1215    PT Stop Time 1300    PT Time Calculation (min) 45 min    Activity Tolerance Patient tolerated treatment well    Behavior During Therapy [facility] for tasks assessed/performed             Past Medical History:  Diagnosis Date   Abnormal Pap smear 03/29/2013   ASC-US +HPV   Infection    UTI   Vaginal Pap smear, abnormal     Past Surgical History:  Procedure Laterality Date   CESAREAN SECTION N/A 06/26/2014   Procedure: CESAREAN SECTION;  Surgeon: Marlow Baars, MD;  Location: [facility];  Service: Obstetrics;  Laterality: N/A;   NO PAST SURGERIES      There were no vitals filed for this visit.   Subjective Assessment - 05/24/21 1219     Subjective Pt reports increased soreness in her BIL flanks today from new exercises, but also reports improved LBP. She also reports daily HEP adherence since last visit.    Currently in Pain? No/denies    Pain Score 3     Pain Location Back    Pain Orientation Left;Right;Lower    Pain Descriptors / Indicators Aching    Pain Type Chronic pain                               [facility] Adult PT Treatment/Exercise - 05/24/21 0001       Lumbar Exercises: Stretches   Other Lumbar Stretch Exercise Cobra stretch 2x30sec      Lumbar Exercises: Standing   Other Standing Lumbar Exercises Hip abduction at cybex machine 37.5# 2x10 BIL    Other Standing Lumbar Exercises Hip extension 2x10 BIL at Cybex machine 50#       Lumbar Exercises: Supine   Dead Bug Other (comment)   3x20 with green physioball     Lumbar Exercises: Prone   Other Prone Lumbar Exercises Standard plank with alternating hip extension 3x10 each side      Knee/Hip Exercises: Standing   Other Standing Knee Exercises SL dead lift with 10# KB 2x10 BIL      Modalities   Modalities Moist Heat      Moist Heat Therapy   Number Minutes Moist Heat 10 Minutes    Moist Heat Location Lumbar Spine                    PT Education - 05/24/21 1247     Education Details Pt educated on proper form when performing new exercises.    Person(s) Educated Patient    Methods Explanation;Demonstration    Comprehension Verbalized understanding;Returned demonstration              PT Short Term Goals - 05/14/21 1659       PT SHORT TERM GOAL #1   Title Pt will report understanding and adherence to her  HEP in order to promote independence in the management of her symptoms.    Baseline Pt reports regular adherence to her HEP    Time 4    Period Weeks    Status Achieved    Target Date 05/24/21      PT SHORT TERM GOAL #2   Title Pt will report 50% decreased hip pain to demonstrate centralization of LBP.    Baseline Pt reports 50% improvement of overall pain with no radicular symptoms over the past 2 weeks.    Time 4    Period Weeks    Status Achieved    Target Date 05/24/21               PT Long Term Goals - 05/14/21 1700       PT LONG TERM GOAL #1   Title Pt will achieve FOTO score of 72% or higher in order to demonstrate improved functional ability as it relates to her LBP.    Baseline 55% at initial eval, 63% on 05/14/2021    Time 8    Period Weeks    Status On-going      PT LONG TERM GOAL #2   Title Pt will demonstrate lumbar flexion AROM of 65 degrees with 2/10 pain or less in order to get dressed without limitation.    Baseline 45 degrees with 6/10 pain    Time 8    Period Weeks    Status New      PT LONG  TERM GOAL #3   Title Pt will report ability to sit longer than 30 minutes with 2/10 pain or less in order to sit at her job without the need to stand for pain relief.    Baseline Pt unable to sit longer than 15 minutes without significant pain    Time 8    Period Weeks    Status New      PT LONG TERM GOAL #4   Title Pt will demonstrate BIL global hip strength of 5/5 in order to progress LE strengthening program without limitation.    Baseline BIL global hip strength ranging from 4-/5 to 4+/5    Time 8    Period Weeks    Status New      PT LONG TERM GOAL #5   Title Pt will demonstrate WNL BIL lumbar rotation with 2/10 pain or less in order to turn when reaching into desk drawers at work without limitation.    Baseline R=68 degrees with 6/10 pain; L=80 degrees with 6/10 pain    Time 8    Period Weeks    Status New                   Plan - 05/24/21 1248     Clinical Impression Statement Pt responded well to all treatment today, demonstrating proper form and no increase in pain with all new exercises. She again reports decrease in pain from 3/10 to 1/10 following moist heat. She will continue to benefit from skilled PT to address her primary impairments and return to her prior level of function without limitation.    Examination-Activity Limitations Bathing;Dressing;Sit;Bed Mobility;Sleep;Squat;Lift;Bend    Examination-Participation Restrictions Occupation;Laundry;Cleaning;Driving    Stability/Clinical Decision Making Evolving/Moderate complexity    Clinical Decision Making Moderate    Rehab Potential Good    PT Frequency 2x / week    PT Duration 8 weeks    PT Treatment/Interventions Joint Manipulations;Spinal Manipulations;Passive range of motion;Dry needling;Taping;Manual techniques;Patient/family education;Neuromuscular re-education;Therapeutic exercise;Functional  mobility training;Therapeutic activities;Traction;Cryotherapy;Electrical Stimulation;Moist Heat;ADLs/Self Care  Home Management;Aquatic Therapy;Biofeedback    PT Next Visit Plan Progress lumbar/ hip strengthening, continue manual therapy vs. lumbar manipulation PRN    PT Home Exercise Plan [id number]    Consulted and Agree with Plan of Care Patient             Patient will benefit from skilled therapeutic intervention in order to improve the following deficits and impairments:  Decreased strength, Impaired flexibility, Pain, Decreased range of motion, Decreased mobility, Hypomobility  Visit Diagnosis: Chronic bilateral low back pain with bilateral sciatica  Muscle weakness (generalized)     Problem List Patient Active Problem List   Diagnosis Date Noted   Left lumbar radiculopathy 10/24/2020   Arrest of descent, delivered, current hospitalization 06/27/2014   Cyst of ovary 11/01/2013   Atypical squamous cell changes of undetermined significance (ASCUS) on cervical cytology with positive high risk human papilloma virus (HPV) 05/03/2013   ECZEMA, ATOPIC DERMATITIS 12/10/2006    Carmelina Dane, PT, DPT 05/24/21 12:50 PM   [facility] Outpatient Rehabilitation [facility] [address] [place], [place], [address] Phone: [phone]   Fax:  [phone]  Name: Susan Solomon MRN: [id number] Date of Birth: 1986-05-17

## 2021-05-28 ENCOUNTER — Telehealth: Payer: Self-pay | Admitting: Neurology

## 2021-05-28 ENCOUNTER — Ambulatory Visit: Payer: 59

## 2021-05-28 ENCOUNTER — Encounter: Payer: Self-pay | Admitting: Neurology

## 2021-05-28 ENCOUNTER — Ambulatory Visit: Payer: 59 | Admitting: Neurology

## 2021-05-28 VITALS — BP 101/69 | HR 63 | Ht 67.0 in | Wt 144.5 lb

## 2021-05-28 DIAGNOSIS — R569 Unspecified convulsions: Secondary | ICD-10-CM | POA: Diagnosis not present

## 2021-05-28 DIAGNOSIS — R55 Syncope and collapse: Secondary | ICD-10-CM

## 2021-05-28 NOTE — Progress Notes (Signed)
[facility]  PATIENT: Susan Solomon DOB: [birth year]  REFERRING CLINICIAN: Olive Bass,* HISTORY FROM: Patient  REASON FOR VISIT: Seizure vs. Syncope work up.    HISTORICAL  CHIEF COMPLAINT:  Chief Complaint  Patient presents with   New Patient (Initial Visit)    New patient:  PCP concerned about fainting Room 13, alone in room    HISTORY OF PRESENT ILLNESS:  Patient is a 35 year old woman with no past medical history who is presenting for syncopal versus seizure work-up.  Patient stated she has a history of passing out for the past 15 years.  Initially it was happening about twice a year but lately has been happening once a year.  Before any syncopal episodes, pulmonary will think overall he needs to see a spot, she will feel weak then she would pass out. She reported she can always tell when she is about to pass out because of these warning signs.  She denies any injuries from the syncope Denies any tongue biting, denies any urinary incontinence, and no shaking associated with the episodes.  Patient denies any post ictal confusion, stated that she is able to recover as soon as she wakes up, she does not have any period of confusion.  She reports in a couple events, after waking up from a syncopal episode she will hear people talk but she cannot respond. These episodes never happened while sitting  Denies history of abnormal heart beats, no heart disease.   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 84 tablet 1   No facility-administered medications prior to visit.    PAST MEDICAL HISTORY: Past Medical History:  Diagnosis Date   Abnormal Pap smear 03/29/2013   ASC-US +HPV   Infection    UTI   Vaginal Pap smear, abnormal     PAST SURGICAL HISTORY: Past Surgical History:  Procedure Laterality Date   CESAREAN SECTION N/A 06/26/2014   Procedure: CESAREAN SECTION;  Surgeon: Marlow Baars, MD;  Location: [facility];  Service: Obstetrics;  Laterality: N/A;   NO PAST SURGERIES      FAMILY HISTORY: Family History  Problem Relation Age of Onset   Cancer Father        colon   Cancer Paternal Aunt        colon   Cancer Paternal Uncle        colon    SOCIAL HISTORY: Social History   Socioeconomic History   Marital status: Single    Spouse name: Not on file   Number of children: Not on file   Years of education: Not on file   Highest education level: Not on file  Occupational History   Not on file  Tobacco Use   Smoking status: Former   Smokeless tobacco: Never   Tobacco comments:    prior to preg  Substance and Sexual Activity   Alcohol use: No   Drug use: No   Sexual activity: Yes    Birth control/protection: Pill, None  Other Topics  Concern   Not on file  Social History Narrative   Lives with daughter   Right Handed   Drinks caffeine occassionally   Social Determinants of Health   Financial Resource Strain: Not on file  Food Insecurity: Not on file  Transportation Needs: Not on file  Physical Activity: Not on file  Stress: Not on file  Social Connections: Not on file  Intimate Partner Violence: Not on file     PHYSICAL EXAM  GENERAL EXAM/CONSTITUTIONAL: Vitals:  Vitals:   05/28/21 1246  BP: 101/69  Pulse: 63  Weight: 144 lb 8 oz (65.5 kg)  Height: 5\' 7"  (1.702 m)   Body mass index is 22.63 kg/m. Wt Readings from Last 3 Encounters:  05/28/21 144 lb 8 oz (65.5 kg)  04/11/21 141 lb (64 kg)  10/24/20 142 lb (64.4 kg)    Patient is in no distress; well developed, nourished and groomed; neck is supple  CARDIOVASCULAR: Examination of carotid arteries is normal; no carotid bruits Regular rate and rhythm, no murmurs Examination of peripheral vascular system by observation and palpation is normal  EYES: Pupils round and reactive to light, Visual fields full to confrontation, Extraocular movements intacts,   MUSCULOSKELETAL: Gait, strength, tone, movements noted in Neurologic exam below  NEUROLOGIC: MENTAL STATUS:  awake, alert, oriented to person, place and time recent and remote memory intact normal attention and concentration language fluent, comprehension intact, naming intact fund of knowledge appropriate  CRANIAL NERVE:  2nd, 3rd, 4th, 6th - pupils equal and reactive to light, visual fields full to confrontation, extraocular muscles intact, no nystagmus 5th - facial sensation symmetric 7th - facial strength symmetric 8th - hearing intact 9th - palate elevates symmetrically, uvula midline 11th - shoulder shrug symmetric 12th - tongue protrusion midline  MOTOR:  normal bulk and tone, full strength in the BUE, BLE  SENSORY:  normal and symmetric to light touch, pinprick, temperature, vibration  COORDINATION:  finger-nose-finger, fine finger movements normal  REFLEXES:  deep tendon reflexes present and symmetric  GAIT/STATION:  normal     DIAGNOSTIC DATA (LABS, IMAGING, TESTING) - I reviewed patient records, labs, notes, testing and imaging myself where available.  Lab Results  Component Value Date   WBC 6.6 04/11/2021   HGB 13.2 04/11/2021   HCT 40.5 04/11/2021   MCV 84.2 04/11/2021   PLT 275.0 04/11/2021      Component Value Date/Time   NA 141 04/11/2021 1444   K 3.9 04/11/2021 1444   CL 106 04/11/2021 1444   CO2 27 04/11/2021 1444   GLUCOSE 83 04/11/2021 1444   BUN 10 04/11/2021 1444   CREATININE 0.73 04/11/2021 1444   CREATININE 0.72 05/23/2020 1158   CALCIUM  9.5 04/11/2021 1444   PROT 7.7 04/11/2021 1444   ALBUMIN 4.7 04/11/2021 1444   AST 12 04/11/2021 1444   ALT 7 04/11/2021 1444   ALKPHOS 46 04/11/2021 1444   BILITOT 0.5 04/11/2021 1444   GFRNONAA >90 07/24/2014 2337   GFRAA >90 07/24/2014 2337   Lab Results  Component Value Date   CHOL 192 05/24/2019   HDL 53.30 05/24/2019   LDLCALC 129 (H) 05/24/2019   TRIG 47.0 05/24/2019   CHOLHDL 4 05/24/2019   Lab Results  Component Value Date   HGBA1C 5.6 04/11/2021   Lab Results  Component Value Date   VITAMINB12 265 04/11/2021   Lab Results  Component Value Date   TSH 1.69 04/11/2021      ASSESSMENT AND PLAN  35 y.o. year  old female with no past medical condition here for syncopal versus seizure work-up.  Patient stated he has she has a history of passing out for the past 15 years, lately they are improved to one event per year when previously she was having at least a couple per year.  She also was get a prodrome sensation of feeling overheated, seeing spots and feeling weak before passing out.  She never had a full neurology or full cardiac work-up in the past.  We will order a EEG and a brain MRI for the seizure work-up.  I informed patient that I will call and inform her about the result.  If all results come back negative she is to follow-up with her primary care doctor and follow-up with her cardiologist as scheduled.    1. Syncope, unspecified syncope type   2. Seizure-like activity (HCC)     PLAN: EEG  MRI Brain with and without contrast  Remain well hydrated  Follow up with your primary care physician and return if worse.   Orders Placed This Encounter  Procedures   MR BRAIN W WO CONTRAST   EEG adult    No orders of the defined types were placed in this encounter.   Return if symptoms worsen or fail to improve.    Windell Norfolk, MD 05/28/2021, 1:47 PM  [facility] [address] [place], [place] [address] [phone]

## 2021-05-28 NOTE — Patient Instructions (Signed)
EEG  MRI Brain with and without contrast  Remain well hydrated  Follow up with your primary care physician and return if worse.

## 2021-05-28 NOTE — Telephone Encounter (Signed)
EEG order sent to [facility], they will reach out to pt to schedule. 

## 2021-05-29 ENCOUNTER — Ambulatory Visit: Payer: 59

## 2021-05-29 DIAGNOSIS — R55 Syncope and collapse: Secondary | ICD-10-CM | POA: Diagnosis not present

## 2021-05-29 DIAGNOSIS — R569 Unspecified convulsions: Secondary | ICD-10-CM

## 2021-05-29 MED ORDER — GADOBENATE DIMEGLUMINE 529 MG/ML IV SOLN
15.0000 mL | Freq: Once | INTRAVENOUS | Status: AC | PRN
  Administered 2021-05-29: 15 mL via INTRAVENOUS

## 2021-05-30 ENCOUNTER — Other Ambulatory Visit: Payer: Self-pay

## 2021-05-30 ENCOUNTER — Ambulatory Visit: Payer: 59 | Admitting: Neurology

## 2021-05-30 DIAGNOSIS — R55 Syncope and collapse: Secondary | ICD-10-CM

## 2021-05-30 DIAGNOSIS — R569 Unspecified convulsions: Secondary | ICD-10-CM

## 2021-05-30 NOTE — Progress Notes (Signed)
EEG completed at [facility]. Full report to follow from Dr. Teresa Coombs

## 2021-05-30 NOTE — Procedures (Signed)
    History:  35 yof Seizure vs. Syncope workup  EEG classification: Normal awake and drowsy  Description of the recording: The background rhythms of this recording consists of a fairly well modulated medium amplitude alpha rhythm of 10-11 Hz that is reactive to eye opening and closure. As the record progresses, the patient appears to remain in the waking state throughout the recording. Photic stimulation was performed, did not show any abnormalities. Hyperventilation was not performed. Toward the end of the recording, the patient enters the drowsy state with slight symmetric slowing seen. The patient never enters stage II sleep. No epileptiform discharges seen during this recording. There was no focal slowing. EKG monitor shows no evidence of cardiac rhythm abnormalities with a heart rate of 60.  Impression: This is a normal EEG recording in the waking and drowsy state. No evidence of interictal epileptiform discharges seen. A normal EEG does not exclude a diagnosis of epilepsy.    Windell Norfolk, MD [facility]

## 2021-05-31 ENCOUNTER — Ambulatory Visit: Payer: 59

## 2021-05-31 DIAGNOSIS — M5442 Lumbago with sciatica, left side: Secondary | ICD-10-CM

## 2021-05-31 DIAGNOSIS — M6281 Muscle weakness (generalized): Secondary | ICD-10-CM

## 2021-05-31 DIAGNOSIS — G8929 Other chronic pain: Secondary | ICD-10-CM

## 2021-05-31 NOTE — Therapy (Signed)
[place], [place], [address] Phone: [phone]   Fax:  [phone]  Physical Therapy Treatment/ Discharge Summary  Patient Details  Name: Susan Solomon MRN: [id number] Date of Birth: [birth year] Referring Provider (PT): Judith Part   Encounter Date: 05/31/2021   PT End of Session - 05/31/21 1232     Visit Number 9    Number of Visits 17    Date for PT Re-Evaluation 06/21/21    Authorization Type UHC 2022, 60 VL, No auth required    PT Start Time 1215    PT Stop Time 1245    PT Time Calculation (min) 30 min    Activity Tolerance Patient tolerated treatment well    Behavior During Therapy [facility] for tasks assessed/performed             Past Medical History:  Diagnosis Date   Abnormal Pap smear 03/29/2013   ASC-US +HPV   Infection    UTI   Vaginal Pap smear, abnormal     Past Surgical History:  Procedure Laterality Date   CESAREAN SECTION N/A 06/26/2014   Procedure: CESAREAN SECTION;  Surgeon: Jerelyn Charles, MD;  Location: [facility];  Service: Obstetrics;  Laterality: N/A;   NO PAST SURGERIES      There were no vitals filed for this visit.   Subjective Assessment - 05/31/21 1219     Subjective Pt reports to clinic with 2/10 pain in her lumbar spine. She reports that she has not had problems sleeping due to pain since starting PT. She adds that due to increased upcoming appointments for her "heart and brain," today with be her last PT visit. She agrees to being ready for discharge today.    How long can you sit comfortably? 2 hours    How long can you stand comfortably? Unlimited    How long can you walk comfortably? Unlimited    Diagnostic tests 11/09/2020 MRI Lumbar without contrast: IMPRESSION:  1. Focal right paracentral disc protrusion at L5-S1 resulting in  mass effect upon the descending right S1 nerve root. Correlate for  radicular symptoms in this distribution.  2. Minimal diffuse  disc bulge at L4-L5.  3. No canal or foraminal stenosis at any level.  4. Remaining lumbar intervertebral disc levels are within normal  limits.    Patient Stated Goals Sleep    Currently in Pain? Yes    Pain Score 2     Pain Location Back    Pain Orientation Left;Right;Lower    Pain Descriptors / Indicators Aching    Pain Type Chronic pain    Pain Onset More than a month ago    Pain Frequency Intermittent                [facility] PT Assessment - 05/31/21 0001       Observation/Other Assessments   Focus on Therapeutic Outcomes (FOTO)  72%      AROM   Lumbar Flexion 70    Lumbar Extension 25 with very minor increase in pain    Lumbar - Right Side Bend 27cm    Lumbar - Left Side Bend 28cm    Lumbar - Right Rotation 85    Lumbar - Left Rotation 93      Strength   Right Hip Flexion 5/5    Right Hip Extension 5/5    Right Hip ABduction 5/5    Left Hip Flexion 5/5    Left Hip Extension 5/5  Left Hip ABduction 5/5    Right Knee Extension 5/5    Right Ankle Dorsiflexion 5/5                                   PT Education - 05/31/21 1232     Education Details Pt educated on specific ways to progress her HEP.    Person(s) Educated Patient    Methods Explanation;Demonstration    Comprehension Verbalized understanding;Returned demonstration              PT Short Term Goals - 05/14/21 1659       PT SHORT TERM GOAL #1   Title Pt will report understanding and adherence to her HEP in order to promote independence in the management of her symptoms.    Baseline Pt reports regular adherence to her HEP    Time 4    Period Weeks    Status Achieved    Target Date 05/24/21      PT SHORT TERM GOAL #2   Title Pt will report 50% decreased hip pain to demonstrate centralization of LBP.    Baseline Pt reports 50% improvement of overall pain with no radicular symptoms over the past 2 weeks.    Time 4    Period Weeks    Status Achieved    Target Date  05/24/21               PT Long Term Goals - 05/31/21 1244       PT LONG TERM GOAL #1   Title Pt will achieve FOTO score of 72% or higher in order to demonstrate improved functional ability as it relates to her LBP.    Baseline 72% on 05/31/2021    Time 8    Period Weeks    Status Achieved      PT LONG TERM GOAL #2   Title Pt will demonstrate lumbar flexion AROM of 65 degrees with 2/10 pain or less in order to get dressed without limitation.    Baseline 70d with 0/10 pain    Time 8    Period Weeks    Status Achieved      PT LONG TERM GOAL #3   Title Pt will report ability to sit longer than 30 minutes with 2/10 pain or less in order to sit at her job without the need to stand for pain relief.    Baseline Pt able to sit 2 hours without pain    Time 8    Period Weeks    Status Achieved      PT LONG TERM GOAL #4   Title Pt will demonstrate BIL global hip strength of 5/5 in order to progress LE strengthening program without limitation.    Baseline BIL global hip strength 5/5    Time 8    Period Weeks    Status Achieved      PT LONG TERM GOAL #5   Title Pt will demonstrate WNL BIL lumbar rotation with 2/10 pain or less in order to turn when reaching into desk drawers at work without limitation.    Baseline R=85 degrees with 0/10 pain; L=93 degrees with 0/10 pain    Time 8    Period Weeks    Status Achieved                   Plan - 05/31/21 1233     Clinical Impression Statement  Due to many upcoming appointments in other practices compounded with the pt feeling independent in the management of her remaining impairments, the pt has opted to be discharged from PT. Upon re-assessment of her initial impairments and goals, the pt has achieved all of her goals from PT. Due to these reasons, the pt is discharged from PT at this time.    Examination-Activity Limitations Bathing;Dressing;Sit;Bed Mobility;Sleep;Squat;Lift;Bend    Examination-Participation Restrictions  Occupation;Laundry;Cleaning;Driving    Stability/Clinical Decision Making Evolving/Moderate complexity    Clinical Decision Making Moderate    PT Next Visit Plan pt is discharged from [facility] and Agree with Plan of Care Patient             Patient will benefit from skilled therapeutic intervention in order to improve the following deficits and impairments:  Decreased strength, Impaired flexibility, Pain, Decreased range of motion, Decreased mobility, Hypomobility  Visit Diagnosis: Chronic bilateral low back pain with bilateral sciatica  Muscle weakness (generalized)     Problem List Patient Active Problem List   Diagnosis Date Noted   Left lumbar radiculopathy 10/24/2020   Arrest of descent, delivered, current hospitalization 06/27/2014   Cyst of ovary 11/01/2013   Atypical squamous cell changes of undetermined significance (ASCUS) on cervical cytology with positive high risk human papilloma virus (HPV) 05/03/2013   ECZEMA, ATOPIC DERMATITIS 12/10/2006     [facility] [place], [place], [address] Phone: [phone]   Fax:  [phone]  Name: Susan Solomon MRN: [id number] Date of Birth: 03/23/1986  PHYSICAL THERAPY DISCHARGE SUMMARY  Visits from Start of Care: 9  Current functional level related to goals / functional outcomes: Pt has met all functional goals of PT   Remaining deficits: Low-level baseline LBP   Education / Equipment: HEP   Patient agrees to discharge. Patient goals were met. Patient is being discharged due to meeting the stated rehab goals.  Vanessa Waubeka, PT, DPT 05/31/21 12:47 PM

## 2021-05-31 NOTE — Patient Instructions (Signed)
Instructed to independently progress HEP. 

## 2021-06-04 ENCOUNTER — Ambulatory Visit: Payer: 59

## 2021-06-06 NOTE — Progress Notes (Signed)
Cardiology Office Note   Date:  06/07/2021   ID:  TAFFANY HEISER, DOB 1986-04-18, MRN [id number]  PCP:  Olive Bass, FNP  Cardiologist:   None Referring:  Olive Bass, FNP  Chief Complaint  Patient presents with   Loss of Consciousness       History of Present Illness: Susan Solomon is a 35 y.o. female who presents for evaluation of syncope.  She is referred by Olive Bass, FNP I reviewed neurology records for this visit.  She had a negative work up with EEG and MRI being normal.      She says she has had syncope she was about 35 years old.  She has had multiple episodes but with last frank syncope in 2020.  She says she feels like she gets hot.  Then her heart starts racing.  Her vision gets blurred.  She might be able to tell somebody she is going out but then she hits the floor.  She has not had frank trauma.  She does not describe orthostatic symptoms.  Otherwise she does not describe tachypalpitations or skipping heartbeats.  She cannot find any trigger.  It happens at rest.  Its not inducible with activity.  She denies any other symptoms such as chest pressure, neck or arm discomfort.  She is up and down with her job and social services.  She moves around and does not have limitations with this.  She is limited currently by back pain and does not exercise routinely.  She has not had any other prior cardiac work-up.      Past Medical History:  Diagnosis Date   Abnormal Pap smear 03/29/2013   ASC-US +HPV   Infection    UTI   Vaginal Pap smear, abnormal     Past Surgical History:  Procedure Laterality Date   CESAREAN SECTION N/A 06/26/2014   Procedure: CESAREAN SECTION;  Surgeon: Marlow Baars, MD;  Location: [facility];  Service: Obstetrics;  Laterality: N/A;   NO PAST SURGERIES       No current outpatient medications on file.   No current facility-administered medications for this visit.    Allergies:   Patient has no known  allergies.    Social History:  The patient  reports that she has quit smoking. She has never used smokeless tobacco. She reports that she does not drink alcohol and does not use drugs.   Family History:  The patient's family history includes Cancer in her father, paternal aunt, and paternal uncle.   There is an aunt who died at 48 but it does not sound like sudden cardiac death.  Otherwise there is no significant cardiac history.   ROS:  Please see the history of present illness.   Otherwise, review of systems are positive for none.   All other systems are reviewed and negative.    PHYSICAL EXAM: VS:  BP 94/62   Pulse (!) 55   Ht 5\' 7"  (1.702 m)   Wt 146 lb (66.2 kg)   LMP  (LMP Unknown)   SpO2 99%   BMI 22.87 kg/m  , BMI Body mass index is 22.87 kg/m. GENERAL:  Well appearing HEENT:  Pupils equal round and reactive, fundi not visualized, oral mucosa unremarkable NECK:  No jugular venous distention, waveform within normal limits, carotid upstroke brisk and symmetric, no bruits, no thyromegaly LYMPHATICS:  No cervical, inguinal adenopathy LUNGS:  Clear to auscultation bilaterally BACK:  No CVA tenderness CHEST:  Unremarkable  HEART:  PMI not displaced or sustained,S1 and S2 within normal limits, no S3, no S4, no clicks, no rubs, no murmurs ABD:  Flat, positive bowel sounds normal in frequency in pitch, no bruits, no rebound, no guarding, no midline pulsatile mass, no hepatomegaly, no splenomegaly EXT:  2 plus pulses throughout, no edema, no cyanosis no clubbing SKIN:  No rashes no nodules NEURO:  Cranial nerves II through XII grossly intact, motor grossly intact throughout PSYCH:  Cognitively intact, oriented to person place and time    EKG:  EKG is ordered today. The ekg ordered today demonstrates sinus rhythm, rate 54, axis within normal limits, intervals within normal limits, no acute ST-T wave changes.     Recent Labs: 04/11/2021: ALT 7; BUN 10; Creatinine, Ser 0.73;  Hemoglobin 13.2; Platelets 275.0; Potassium 3.9; Sodium 141; TSH 1.69    Lipid Panel    Component Value Date/Time   CHOL 192 05/24/2019 1135   TRIG 47.0 05/24/2019 1135   HDL 53.30 05/24/2019 1135   CHOLHDL 4 05/24/2019 1135   VLDL 9.4 05/24/2019 1135   LDLCALC 129 (H) 05/24/2019 1135      Wt Readings from Last 3 Encounters:  06/07/21 146 lb (66.2 kg)  05/28/21 144 lb 8 oz (65.5 kg)  04/11/21 141 lb (64 kg)      Other studies Reviewed: Additional studies/ records that were reviewed today include: Neurology records. Review of the above records demonstrates:  Please see elsewhere in the note.     ASSESSMENT AND PLAN:  Syncope: The patient has a longstanding history of syncope.  Today she was not orthostatic in the office.  I do not suspect any structural heart disease.  My suggestion would be a 4-week monitor which we will arrange.  We talked about presenting if she has worsening prodromes in the future.  She has had blood work to include thyroid and electrolytes.  The neuro work-up as mentioned is unremarkable.  Hypotension: She starts with a low blood pressure.  We talked about salt and fluid restriction.  She does not really have symptoms most of the time so I do not think she needs compression garments although we talked about these.  At this point I do not see an indication for midodrine or mineralocorticoids..   Current medicines are reviewed at length with the patient today.  The patient does not have concerns regarding medicines.  The following changes have been made:  no change  Labs/ tests ordered today include:   Orders Placed This Encounter  Procedures   CARDIAC EVENT MONITOR   EKG 12-Lead      Disposition:   FU with me as needed.   Signed, Rollene Rotunda, MD  06/07/2021 12:52 PM    [facility]

## 2021-06-07 ENCOUNTER — Ambulatory Visit (INDEPENDENT_AMBULATORY_CARE_PROVIDER_SITE_OTHER): Payer: 59 | Admitting: Cardiology

## 2021-06-07 ENCOUNTER — Other Ambulatory Visit: Payer: Self-pay

## 2021-06-07 ENCOUNTER — Ambulatory Visit: Payer: 59

## 2021-06-07 ENCOUNTER — Encounter: Payer: Self-pay | Admitting: Cardiology

## 2021-06-07 ENCOUNTER — Encounter: Payer: Self-pay | Admitting: Radiology

## 2021-06-07 VITALS — BP 94/62 | HR 55 | Ht 67.0 in | Wt 146.0 lb

## 2021-06-07 DIAGNOSIS — R55 Syncope and collapse: Secondary | ICD-10-CM

## 2021-06-07 NOTE — Patient Instructions (Signed)
Medication Instructions:  NO CHANGES  Testing/Procedures: Dr. Antoine Poche has ordered a 4 week event monitor. This will come in the mail with all the instructions for how to apply/send back once finished   Follow-Up: At [facility], you and your health needs are our priority.  As part of our continuing mission to provide you with exceptional heart care, we have created designated Provider Care Teams.  These Care Teams include your primary Cardiologist (physician) and Advanced Practice Providers (APPs -  Physician Assistants and Nurse Practitioners) who all work together to provide you with the care you need, when you need it.  We recommend signing up for the patient portal called "MyChart".  Sign up information is provided on this After Visit Summary.  MyChart is used to connect with patients for Virtual Visits (Telemedicine).  Patients are able to view lab/test results, encounter notes, upcoming appointments, etc.  Non-urgent messages can be sent to your provider as well.   To learn more about what you can do with MyChart, go to [email or web address].    Your next appointment:   AS NEEDED -- will be based on monitor results

## 2021-06-07 NOTE — Progress Notes (Signed)
Enrolled patient for a 30 day [employer] Event Monitor to be mailed to patients home  

## 2021-06-11 ENCOUNTER — Ambulatory Visit (INDEPENDENT_AMBULATORY_CARE_PROVIDER_SITE_OTHER): Payer: 59

## 2021-06-11 DIAGNOSIS — R55 Syncope and collapse: Secondary | ICD-10-CM | POA: Diagnosis not present

## 2021-06-14 ENCOUNTER — Ambulatory Visit: Payer: 59

## 2021-06-14 ENCOUNTER — Encounter: Payer: 59 | Admitting: Physical Therapy

## 2021-10-13 NOTE — L&D Delivery Note (Signed)
Delivery Note At 3:26 PM a viable female was delivered via Vaginal, Vacuum (Extractor) (Presentation: Left Occiput Anterior).  APGAR: 8, 8 (NICU ASSESMENT required CPAP and suctioning of thick meconium); weight 9 lb 15 oz (4508 g).   Placenta status: manual removal, friable cord with tear with minimal traction, 2g Ancef given.  Cord: 3 vessels with the following complications: nuchal x1  Anesthesia: None Episiotomy: None Lacerations: Sulcus;2nd degree Suture Repair2-0. 3-0 and 4-0 vicryl Est. Blood Loss (mL):  550cc RVS exam WNL before and after repair  Mom to postpartum.  Baby to Couplet care / Skin to Skin.  Susan Solomon 08/22/2022, 4:09 PM

## 2021-12-16 ENCOUNTER — Ambulatory Visit: Payer: 59 | Admitting: Family Medicine

## 2021-12-16 ENCOUNTER — Encounter: Payer: Self-pay | Admitting: Family Medicine

## 2021-12-16 VITALS — BP 108/64 | HR 76 | Temp 98.0°F | Ht 67.0 in | Wt 142.4 lb

## 2021-12-16 DIAGNOSIS — Z3201 Encounter for pregnancy test, result positive: Secondary | ICD-10-CM

## 2021-12-16 NOTE — Progress Notes (Signed)
Chief Complaint  ?Patient presents with  ? Follow-up  ?  Wants blood test to confirm pregnancy  ? ? ?Subjective: ?Patient is a 36 y.o. female here for + pregnancy test. ? ?LMP 11/16/21. G1P1001, missed period by 2 days which is uncommon for her. Took a preg test yesterday and was +. She did have unprotected intercourse around 1 mo ago. She is not taking a prenatal vitamin. She had no issues with her first pregnancy. She is not on any prescription medications. She does not currently have a OB/GYN.  ? ?Past Medical History:  ?Diagnosis Date  ? Abnormal Pap smear 03/29/2013  ? ASC-US +HPV  ? Infection   ? UTI  ? Vaginal Pap smear, abnormal   ? ? ?Objective: ?BP 108/64   Pulse 76   Temp 98 ?F (36.7 ?C) (Oral)   Ht 5\' 7"  (1.702 m)   Wt 142 lb 6 oz (64.6 kg)   SpO2 94%   BMI 22.30 kg/m?  ?General: Awake, appears stated age ?Heart: RRR, no LE edema ?Lungs: CTAB, no rales, wheezes or rhonchi. No accessory muscle use ?Psych: Age appropriate judgment and insight, normal affect and mood ? ?Assessment and Plan: ?Positive pregnancy test - Plan: hCG, serum, qualitative ? ?Ck above at her request. If +, will also place OB referral, but gave her some [facility] info in her paperwork for OB/GYN across hall. Start PNV. No meds to adjust.  ?F/u as originally scheduled.  ?The patient voiced understanding and agreement to the plan. ? ? , DO ?12/16/21  ?10:39 AM ? ? ? ? ?

## 2021-12-16 NOTE — Patient Instructions (Addendum)
Please start a prenatal vitamin ? ?Give [place] 2-3 business days to get the results of your labs back.  ? ?Call Center for [facility] at [phone] for an appointment. ? ?They are located at [address], [place], [place], [address] (right across the hall from our office). ? ?Let us know if you need anything. ?

## 2021-12-17 ENCOUNTER — Other Ambulatory Visit: Payer: Self-pay | Admitting: Family Medicine

## 2021-12-17 DIAGNOSIS — Z3201 Encounter for pregnancy test, result positive: Secondary | ICD-10-CM

## 2021-12-17 LAB — HCG, SERUM, QUALITATIVE: Preg, Serum: POSITIVE — AB

## 2022-01-10 ENCOUNTER — Encounter (HOSPITAL_BASED_OUTPATIENT_CLINIC_OR_DEPARTMENT_OTHER): Payer: 59

## 2022-01-21 LAB — OB RESULTS CONSOLE HEPATITIS B SURFACE ANTIGEN: Hepatitis B Surface Ag: NEGATIVE

## 2022-01-21 LAB — OB RESULTS CONSOLE GC/CHLAMYDIA
Chlamydia: NEGATIVE
Neisseria Gonorrhea: NEGATIVE

## 2022-01-21 LAB — OB RESULTS CONSOLE HIV ANTIBODY (ROUTINE TESTING): HIV: NONREACTIVE

## 2022-01-21 LAB — OB RESULTS CONSOLE RUBELLA ANTIBODY, IGM: Rubella: IMMUNE

## 2022-01-21 LAB — OB RESULTS CONSOLE ANTIBODY SCREEN: Antibody Screen: NEGATIVE

## 2022-01-21 LAB — HEPATITIS C ANTIBODY: HCV Ab: NEGATIVE

## 2022-01-21 LAB — OB RESULTS CONSOLE ABO/RH: RH Type: POSITIVE

## 2022-01-21 LAB — OB RESULTS CONSOLE RPR: RPR: NONREACTIVE

## 2022-07-31 LAB — OB RESULTS CONSOLE GBS: GBS: NEGATIVE

## 2022-08-20 ENCOUNTER — Telehealth (HOSPITAL_COMMUNITY): Payer: Self-pay | Admitting: *Deleted

## 2022-08-20 ENCOUNTER — Encounter (HOSPITAL_COMMUNITY): Payer: Self-pay | Admitting: *Deleted

## 2022-08-20 NOTE — Telephone Encounter (Signed)
Preadmission screen  

## 2022-08-21 ENCOUNTER — Encounter (HOSPITAL_COMMUNITY): Payer: Self-pay | Admitting: *Deleted

## 2022-08-22 ENCOUNTER — Other Ambulatory Visit: Payer: Self-pay

## 2022-08-22 ENCOUNTER — Encounter (HOSPITAL_COMMUNITY): Payer: Self-pay | Admitting: Obstetrics and Gynecology

## 2022-08-22 ENCOUNTER — Inpatient Hospital Stay (HOSPITAL_COMMUNITY)
Admission: AD | Admit: 2022-08-22 | Discharge: 2022-08-24 | DRG: 807 | Disposition: A | Discharge status: Home / Self Care | Payer: 59 | Attending: Obstetrics and Gynecology | Admitting: Obstetrics and Gynecology

## 2022-08-22 DIAGNOSIS — Z3A39 39 weeks gestation of pregnancy: Secondary | ICD-10-CM

## 2022-08-22 DIAGNOSIS — O26893 Other specified pregnancy related conditions, third trimester: Secondary | ICD-10-CM | POA: Diagnosis present

## 2022-08-22 DIAGNOSIS — Z3490 Encounter for supervision of normal pregnancy, unspecified, unspecified trimester: Secondary | ICD-10-CM

## 2022-08-22 DIAGNOSIS — O34219 Maternal care for unspecified type scar from previous cesarean delivery: Secondary | ICD-10-CM | POA: Diagnosis present

## 2022-08-22 DIAGNOSIS — Z87891 Personal history of nicotine dependence: Secondary | ICD-10-CM

## 2022-08-22 LAB — CBC
HCT: 42.3 % (ref 36.0–46.0)
Hemoglobin: 13.9 g/dL (ref 12.0–15.0)
MCH: 26.3 pg (ref 26.0–34.0)
MCHC: 32.9 g/dL (ref 30.0–36.0)
MCV: 80 fL (ref 80.0–100.0)
Platelets: 259 10*3/uL (ref 150–400)
RBC: 5.29 MIL/uL — ABNORMAL HIGH (ref 3.87–5.11)
RDW: 20 % — ABNORMAL HIGH (ref 11.5–15.5)
WBC: 9.6 10*3/uL (ref 4.0–10.5)
nRBC: 0 % (ref 0.0–0.2)

## 2022-08-22 LAB — TYPE AND SCREEN
ABO/RH(D): O POS
Antibody Screen: NEGATIVE

## 2022-08-22 MED ORDER — OXYCODONE-ACETAMINOPHEN 5-325 MG PO TABS: 1.0000 | ORAL_TABLET | ORAL | Status: DC | PRN

## 2022-08-22 MED ORDER — BENZOCAINE-MENTHOL 20-0.5 % EX AERO
1.0000 | INHALATION_SPRAY | CUTANEOUS | Status: DC | PRN
  Administered 2022-08-22 – 2022-08-24 (×2): 1 via TOPICAL
  Filled 2022-08-22 (×2): qty 56

## 2022-08-22 MED ORDER — ACETAMINOPHEN 325 MG PO TABS
650.0000 mg | ORAL_TABLET | ORAL | Status: DC | PRN
  Administered 2022-08-23: 650 mg via ORAL
  Filled 2022-08-22: qty 2

## 2022-08-22 MED ORDER — ONDANSETRON HCL 4 MG/2ML IJ SOLN: 4.0000 mg | INTRAMUSCULAR | Status: DC | PRN

## 2022-08-22 MED ORDER — OXYTOCIN BOLUS FROM INFUSION: 333.0000 mL | Freq: Once | INTRAVENOUS | Status: AC

## 2022-08-22 MED ORDER — IBUPROFEN 600 MG PO TABS
600.0000 mg | ORAL_TABLET | Freq: Four times a day (QID) | ORAL | Status: DC
  Administered 2022-08-22 – 2022-08-24 (×8): 600 mg via ORAL
  Filled 2022-08-22 (×9): qty 1

## 2022-08-22 MED ORDER — ONDANSETRON HCL 4 MG PO TABS: 4.0000 mg | ORAL_TABLET | ORAL | Status: DC | PRN

## 2022-08-22 MED ORDER — OXYTOCIN-SODIUM CHLORIDE 30-0.9 UT/500ML-% IV SOLN
2.5000 [IU]/h | INTRAVENOUS | Status: DC
  Administered 2022-08-22: 2.5 [IU]/h via INTRAVENOUS

## 2022-08-22 MED ORDER — SOD CITRATE-CITRIC ACID 500-334 MG/5ML PO SOLN: 30.0000 mL | ORAL | Status: DC | PRN

## 2022-08-22 MED ORDER — FENTANYL CITRATE (PF) 100 MCG/2ML IJ SOLN: 100.0000 ug | Freq: Once | INTRAMUSCULAR | Status: AC

## 2022-08-22 MED ORDER — ONDANSETRON HCL 4 MG/2ML IJ SOLN: 4.0000 mg | Freq: Four times a day (QID) | INTRAMUSCULAR | Status: DC | PRN

## 2022-08-22 MED ORDER — SENNOSIDES-DOCUSATE SODIUM 8.6-50 MG PO TABS
2.0000 | ORAL_TABLET | Freq: Every day | ORAL | Status: DC
  Administered 2022-08-23: 2 via ORAL
  Filled 2022-08-22: qty 2

## 2022-08-22 MED ORDER — ACETAMINOPHEN 325 MG PO TABS: 650.0000 mg | ORAL_TABLET | ORAL | Status: DC | PRN

## 2022-08-22 MED ORDER — WITCH HAZEL-GLYCERIN EX PADS: 1.0000 | MEDICATED_PAD | CUTANEOUS | Status: DC | PRN

## 2022-08-22 MED ORDER — PRENATAL MULTIVITAMIN CH
1.0000 | ORAL_TABLET | Freq: Every day | ORAL | Status: DC
  Administered 2022-08-23 – 2022-08-24 (×2): 1 via ORAL
  Filled 2022-08-22 (×2): qty 1

## 2022-08-22 MED ORDER — DIBUCAINE (PERIANAL) 1 % EX OINT: 1.0000 | TOPICAL_OINTMENT | CUTANEOUS | Status: DC | PRN

## 2022-08-22 MED ORDER — TETANUS-DIPHTH-ACELL PERTUSSIS 5-2.5-18.5 LF-MCG/0.5 IM SUSY: 0.5000 mL | PREFILLED_SYRINGE | Freq: Once | INTRAMUSCULAR | Status: DC

## 2022-08-22 MED ORDER — CEFAZOLIN SODIUM-DEXTROSE 2-4 GM/100ML-% IV SOLN
2.0000 g | Freq: Once | INTRAVENOUS | Status: AC
  Administered 2022-08-22: 2 g via INTRAVENOUS
  Filled 2022-08-22: qty 100

## 2022-08-22 MED ORDER — LIDOCAINE HCL (PF) 1 % IJ SOLN: 30.0000 mL | INTRAMUSCULAR | Status: AC | PRN

## 2022-08-22 MED ORDER — LACTATED RINGERS IV SOLN: 500.0000 mL | INTRAVENOUS | Status: DC | PRN

## 2022-08-22 MED ORDER — LIDOCAINE HCL (PF) 1 % IJ SOLN
INTRAMUSCULAR | Status: AC
  Administered 2022-08-22: 30 mL
  Filled 2022-08-22: qty 30

## 2022-08-22 MED ORDER — DIPHENHYDRAMINE HCL 25 MG PO CAPS: 25.0000 mg | ORAL_CAPSULE | Freq: Four times a day (QID) | ORAL | Status: DC | PRN

## 2022-08-22 MED ORDER — LIDOCAINE HCL (PF) 1 % IJ SOLN
30.0000 mL | INTRAMUSCULAR | Status: AC | PRN
  Administered 2022-08-22: 30 mL via SUBCUTANEOUS
  Filled 2022-08-22: qty 30

## 2022-08-22 MED ORDER — ZOLPIDEM TARTRATE 5 MG PO TABS: 5.0000 mg | ORAL_TABLET | Freq: Every evening | ORAL | Status: DC | PRN

## 2022-08-22 MED ORDER — COCONUT OIL OIL: 1.0000 | TOPICAL_OIL | Status: DC | PRN

## 2022-08-22 MED ORDER — LACTATED RINGERS IV SOLN: INTRAVENOUS | Status: DC

## 2022-08-22 MED ORDER — OXYTOCIN-SODIUM CHLORIDE 30-0.9 UT/500ML-% IV SOLN
INTRAVENOUS | Status: AC
  Administered 2022-08-22: 333 mL via INTRAVENOUS
  Filled 2022-08-22: qty 500

## 2022-08-22 MED ORDER — SIMETHICONE 80 MG PO CHEW: 80.0000 mg | CHEWABLE_TABLET | ORAL | Status: DC | PRN

## 2022-08-22 MED ORDER — OXYCODONE-ACETAMINOPHEN 5-325 MG PO TABS: 2.0000 | ORAL_TABLET | ORAL | Status: DC | PRN

## 2022-08-22 MED ORDER — FENTANYL CITRATE (PF) 100 MCG/2ML IJ SOLN
INTRAMUSCULAR | Status: AC
  Administered 2022-08-22: 100 ug via INTRAVENOUS
  Filled 2022-08-22: qty 2

## 2022-08-22 NOTE — H&P (Signed)
LATE ENTRY Susan Solomon is a 36 y.o. female presenting for contractions and rupture of membranes. Contractions began around noon, presented to MAU and en route noticed heavy meconium stained SROM. TOLAC patient, baby boy, GBS neg. Ho/ csx x1 for arrest of descent. OB History     Gravida  2   Para  1   Term  1   Preterm      AB      Living  1      SAB      IAB      Ectopic      Multiple      Live Births  1          Past Medical History:  Diagnosis Date   Abnormal Pap smear 03/29/2013   ASC-US +HPV   Infection    UTI   Vaginal Pap smear, abnormal    Past Surgical History:  Procedure Laterality Date   CESAREAN SECTION N/A 06/26/2014   Procedure: CESAREAN SECTION;  Surgeon: Marlow Baars, MD;  Location: [facility];  Service: Obstetrics;  Laterality: N/A;   NO PAST SURGERIES     Family History: family history includes Cancer in her father, paternal aunt, and paternal uncle. Social History:  reports that she has quit smoking. She has never used smokeless tobacco. She reports that she does not drink alcohol and does not use drugs.     Maternal Diabetes: No 1hr 103 Genetic Screening: Declined Maternal Ultrasounds/Referrals: Normal Fetal Ultrasounds or other Referrals:  None Maternal Substance Abuse:  No Significant Maternal Medications:  None Significant Maternal Lab Results:  Group B Strep negative Number of Prenatal Visits:greater than 3 verified prenatal visits Other Comments:  None  Review of Systems  Constitutional:  Negative for chills and fever.  Respiratory:  Negative for shortness of breath.   Cardiovascular:  Negative for chest pain, palpitations and leg swelling.  Gastrointestinal:  Negative for abdominal pain and vomiting.  Genitourinary:  Positive for vaginal discharge. Decreased urine volume: green AF. Neurological:  Negative for dizziness, weakness and headaches.  Psychiatric/Behavioral:  Negative for suicidal ideas.    Maternal Medical  History:  Reason for admission: Rupture of membranes and contractions.   Contractions: Onset was less than 1 hour ago.   Perceived severity is strong.   Fetal activity: Perceived fetal activity is normal.     Dilation: 10 Station: 0 Exam by:: Dr. Reina Fuse Blood pressure 101/86, pulse (!) 120, temperature 97.6 F (36.4 C), temperature source Oral, resp. rate 20, height 5\' 8"  (1.727 m), weight 88.5 kg, SpO2 100 %. Exam Physical Exam Constitutional:      General: She is not in acute distress.    Appearance: She is well-developed.  HENT:     Head: Normocephalic and atraumatic.  Eyes:     Pupils: Pupils are equal, round, and reactive to light.  Cardiovascular:     Rate and Rhythm: Normal rate and regular rhythm.     Heart sounds: No murmur heard.    No gallop.  Abdominal:     Tenderness: There is no abdominal tenderness. There is no guarding or rebound.  Genitourinary:    Vagina: Normal.  Musculoskeletal:        General: Normal range of motion.     Cervical back: Normal range of motion and neck supple.  Skin:    General: Skin is warm and dry.  Neurological:     Mental Status: She is alert and oriented to person, place, and time.  Prenatal labs: ABO, Rh: --/--/O POS (11/10 1415) Antibody: NEG (11/10 1415) Rubella: Immune (04/11 0000) RPR: Nonreactive (04/11 0000)  HBsAg: Negative (04/11 0000)  HIV: Non-reactive (04/11 0000)  GBS: Negative/-- (10/19 0000)   Assessment/Plan: This is a 36yo G2P1001 @ 39 6/7 presenting with frank SROM and complete exam to MAU, precip labor. TOLAC patient, verbally confirmed desire for TOLAC R/B/A of TOLAC discussed with patient. Alternative would be scheduled elective repeat cesarean section. Successful VBAC would mean shorter postpartum stay and decreased risk of bleeding. Risks of TOLAC include risk of uterine rupture <1%; this may result in emergent section, possible transfusion and even cesarean-hysterectomy. Risks of surgery include  infection of the uterus, pelvic organs, or skin, inadvertent injury to internal organs, such as bowel or bladder. If there is major injury, extensive surgery may be required. If injury is minor, it may be treated with relative ease. Discussed possibility of excessive blood loss and transfusion. If bleeding cannot be controlled using medical or minor surgical methods, a cesarean hysterectomy may be performed which would mean no future fertility. Patient accepts the possibility of blood transfusion, if necessary. Patient understands and agrees to move forward with TOLAC.    Valerie Roys Chisum Habenicht 08/22/2022, 4:06 PM

## 2022-08-22 NOTE — MAU Note (Signed)
Susan Solomon is a 36 y.o. at [redacted]w[redacted]d here in MAU reporting: call received from front desk, pt very active labor.  Retrieved pt while husband registering.  Pt reports was 3+cm when last checked.  2nd baby. Denies problems with pregnancy. Due tomorrow.  Ctx's started at ~1200, SROM just before arriving  ~1350, thick mec noted.  SVE at 1403 9/100/0st, vtx.  1404: FH 140, down to 110 with ctx,recovered.  1406: fh 135, monitor off for transfer to L&D via stretcher.  Carloyn Jaeger, CNM present and along for transfer  Onset of complaint: 1200 Pain score: wanted epid There were no vitals filed for this visit.   FHT:140

## 2022-08-22 NOTE — Lactation Note (Signed)
This note was copied from a baby's chart. Lactation Consultation Note  Patient Name: Boy Oveta Idris [id number] Date: 08/22/2022 Reason for consult: Initial assessment;Term (LGA infant greater than 9 lbs at birth.)  Age:34 hours See maternal data below for Birth Parent past hx of breastfeeding. Birth Parent has two DEBP's at home a [employer] and [employer] Parent latched infant on her right breast using the cradle hold position, LC broke latch multiple times and worked on infant having a deeper latch and not slipping down to tip of Birth Parent's nipple. After few attempts infant started sustaining latch and was  infant still breastfeeding after 10 minutes when LC left the room. Birth Parent knows how to hand express and self expressed in teaching when [facility] use breast model. Birth Parent will continue to work towards latching infant at the breast, feeding by cues, on demand, 8 to 12 times within 24 hours, STS. Birth Parent knows to call RN/LC if further latch assistance is needed. LC discussed importance of maternal diet, rest  and hydration with Birth Parent.  Mom made aware of O/P services, breastfeeding support groups, community resources, and our phone # for post-discharge questions.   Maternal Data Has patient been taught Hand Expression?: Yes Does the patient have breastfeeding experience prior to this delivery?: Yes How long did the patient breastfeed?: Per Birth Parent , she BF 1st child for one month, she is currently 75 years old.  Feeding Mother's Current Feeding Choice: Breast Milk  LATCH Score Latch: Repeated attempts needed to sustain latch, nipple held in mouth throughout feeding, stimulation needed to elicit sucking reflex.  Audible Swallowing: A few with stimulation  Type of Nipple: Everted at rest and after stimulation  Comfort (Breast/Nipple): Soft / non-tender  Hold (Positioning): Assistance needed to correctly position infant at breast and maintain latch.  LATCH  Score: 7   Lactation Tools Discussed/Used    Interventions Interventions: Assisted with latch;Breast feeding basics reviewed;Skin to skin;Reverse pressure;Position options;Support pillows;Adjust position;Education;LC Services brochure  Discharge    Consult Status Consult Status: Follow-up Date: 08/23/22 Follow-up type: In-patient    Frederico Hamman 08/22/2022, 10:19 PM

## 2022-08-23 LAB — CBC
HCT: 33.8 % — ABNORMAL LOW (ref 36.0–46.0)
Hemoglobin: 10.9 g/dL — ABNORMAL LOW (ref 12.0–15.0)
MCH: 26.1 pg (ref 26.0–34.0)
MCHC: 32.2 g/dL (ref 30.0–36.0)
MCV: 81.1 fL (ref 80.0–100.0)
Platelets: 232 10*3/uL (ref 150–400)
RBC: 4.17 MIL/uL (ref 3.87–5.11)
RDW: 19.5 % — ABNORMAL HIGH (ref 11.5–15.5)
WBC: 14.9 10*3/uL — ABNORMAL HIGH (ref 4.0–10.5)
nRBC: 0 % (ref 0.0–0.2)

## 2022-08-23 LAB — RPR: RPR Ser Ql: NONREACTIVE

## 2022-08-23 NOTE — Progress Notes (Signed)
Patient is doing well.  She is ambulating, voiding, tolerating PO.  Pain control is good.  Lochia is appropriate  Vitals:   08/22/22 1921 08/23/22 0004 08/23/22 0530 08/23/22 0757  BP: 110/70 (!) 100/52 (!) 96/49 109/64  Pulse: 81 85 72 66  Resp: 18 18 18    Temp: 98.3 F (36.8 C) 98.1 F (36.7 C) 98 F (36.7 C) 98.2 F (36.8 C)  TempSrc: Oral Oral Oral Oral  SpO2: 100% 100% 99% 98%  Weight:      Height:        NAD Fundus firm Ext: no edema  Lab Results  Component Value Date   WBC 14.9 (H) 08/23/2022   HGB 10.9 (L) 08/23/2022   HCT 33.8 (L) 08/23/2022   MCV 81.1 08/23/2022   PLT 232 08/23/2022    --/--/O POS (11/10 1415)  A/P 36 y.o. 11-25-2002 PPD#1 s/p TSVD. Routine care.   Expect d/c tomorrow --ok for discharge today if patient elects for 24 hours discharge; at this time they desire to stay  Desires circumcision.   Discussed r/b/a of the procedure.  Reviewed that circumcision is an elective surgical procedure and not considered medically necessary.  Reviewed the risks of the procedure including the risk of infection, bleeding, damage to surrounding structures, including scrotum, shaft, urethra and head of penis, and an undesired cosmetic effect requiring additional procedures for revision.  Consent signed.  [id number]    [facility]

## 2022-08-23 NOTE — Lactation Note (Signed)
This note was copied from a baby's chart. Lactation Consultation Note  Patient Name: Susan Solomon Date: 08/23/2022 Reason for consult: L&D Initial assessment;Follow-up assessment;Term;Infant weight loss;Breastfeeding assistance (1.84% WL) Age:34 hours  LC entered the room and the infant was asleep in the bassinet.  Per the birth parent, the infant has been doing well with breastfeeding.  She stated that he latches deeply and denies any pain.  LC spoke with the parents about cluster feeding.  They stated that they had no further questions or concerns.  The birth parent will call RN/LC for assistance with breastfeeding.   Maternal Data    Feeding Mother's Current Feeding Choice: Breast Milk  LATCH Score                    Lactation Tools Discussed/Used    Interventions Interventions: Breast feeding basics reviewed;Education  Discharge    Consult Status Consult Status: Follow-up Date: 08/24/22 Follow-up type: In-patient    Susan Solomon 08/23/2022, 1:45 PM

## 2022-08-24 ENCOUNTER — Inpatient Hospital Stay (HOSPITAL_COMMUNITY): Payer: 59 | Admitting: Anesthesiology

## 2022-08-24 NOTE — Discharge Instructions (Signed)
You may take ibuprofen 600mg  every 6 hours as needed for pain.  You may add acetaminophen (tylenol) 1000 mg every 6 hours as needed with the ibuprofen.

## 2022-08-24 NOTE — Discharge Summary (Signed)
Postpartum Discharge Summary  Date of Service updated 08/24/22     Patient Name: Susan Solomon DOB: 1985-11-22 MRN: [id number]  Date of admission: 08/22/2022 Delivery date:08/22/2022  Delivering provider: Carlisle Cater  Date of discharge: 08/24/2022  Admitting diagnosis: Pregnancy [Z34.90] Intrauterine pregnancy: [redacted]w[redacted]d     Secondary diagnosis:  Principal Problem:   Pregnancy  Additional problems: Trial of labor after cesarean, precipitous delivery    Discharge diagnosis: Term Pregnancy Delivered and VBAC                                              Post partum procedures: n/a Augmentation: N/A Complications: None  Hospital course: Onset of Labor With Vaginal Delivery      36 y.o. yo [id number] at [redacted]w[redacted]d was admitted in Active Labor on 08/22/2022. Labor course was complicated by manual removal of the placenta Membrane Rupture Time/Date: 1:45 PM ,08/22/2022   Delivery Method:VBAC, Vacuum Assisted  Episiotomy: None  Lacerations:  Sulcus;2nd degree;Perineal  Patient had a postpartum course complicated by n/a.  She is ambulating, tolerating a regular diet, passing flatus, and urinating well. Patient is discharged home in stable condition on 08/24/22.  Newborn Data: Birth date:08/22/2022  Birth time:3:26 PM  Gender:Female  Living status:Living  Apgars:8 ,8  Weight:4508 g    Physical exam  Vitals:   08/23/22 0757 08/23/22 1532 08/23/22 2116 08/24/22 0652  BP: 109/64 (!) 99/58 99/60 108/75  Pulse: 66  86 79  Resp:  18 18 16   Temp: 98.2 F (36.8 C) 98.5 F (36.9 C) 98.8 F (37.1 C) 98.1 F (36.7 C)  TempSrc: Oral Oral Oral Oral  SpO2: 98% 100%    Weight:      Height:       General: alert, cooperative, and no distress Lochia: appropriate Uterine Fundus: firm Incision: N/A DVT Evaluation: No evidence of DVT seen on physical exam. Labs: Lab Results  Component Value Date   WBC 14.9 (H) 08/23/2022   HGB 10.9 (L) 08/23/2022   HCT 33.8 (L) 08/23/2022   MCV  81.1 08/23/2022   PLT 232 08/23/2022      Latest Ref Rng & Units 04/11/2021    2:44 PM  CMP  Glucose 70 - 99 mg/dL 83   BUN 6 - 23 mg/dL 10   Creatinine 04/13/2021 - 1.20 mg/dL 1.32   Sodium 4.40 - 102 mEq/L 141   Potassium 3.5 - 5.1 mEq/L 3.9   Chloride 96 - 112 mEq/L 106   CO2 19 - 32 mEq/L 27   Calcium 8.4 - 10.5 mg/dL 9.5   Total Protein 6.0 - 8.3 g/dL 7.7   Total Bilirubin 0.2 - 1.2 mg/dL 0.5   Alkaline Phos 39 - 117 U/L 46   AST 0 - 37 U/L 12   ALT 0 - 35 U/L 7    Edinburgh Score:    08/23/2022    3:12 PM  Edinburgh Postnatal Depression Scale Screening Tool  I have been able to laugh and see the funny side of things. 0  I have looked forward with enjoyment to things. 0  I have blamed myself unnecessarily when things went wrong. 1  I have been anxious or worried for no good reason. 0  I have felt scared or panicky for no good reason. 0  Things have been getting on top of me. 0  I  have been so unhappy that I have had difficulty sleeping. 0  I have felt sad or miserable. 0  I have been so unhappy that I have been crying. 0  The thought of harming myself has occurred to me. 0  Edinburgh Postnatal Depression Scale Total 1      After visit meds:  Allergies as of 08/24/2022   No Known Allergies      Medication List     STOP taking these medications    ferrous sulfate 324 MG Tbec         Discharge home in stable condition Infant Feeding: Breast Infant Disposition:home with mother Discharge instruction: per After Visit Summary and Postpartum booklet. Activity: Advance as tolerated. Pelvic rest for 6 weeks.  Diet: routine diet Anticipated Birth Control: Unsure Postpartum Appointment:6 weeks Additional Postpartum F/U:  n/a Future Appointments:No future appointments. Follow up Visit:  Follow-up Information     Shivaji, Valerie Roys, MD Follow up in 6 week(s).   Specialty: Obstetrics and Gynecology Contact information: [address] [place]  [address] [phone]                     08/24/2022 [facility] Lizabeth Leyden, MD

## 2022-08-24 NOTE — Lactation Note (Signed)
This note was copied from a baby's chart. Lactation Consultation Note  Patient Name: Boy Tahja Liao MCNOB'S Date: 08/24/2022 Reason for consult: Follow-up assessment;Term;Infant weight loss (8 % weight loss, LC reviewed Doc flow sheets with Parents comfirmed they have been updated. per mom baby is breast feeding  good both breast and  milk feels like its coming in.  LC reveiewed BF D/C teaching and LC resoures after D/C.) Age:34 hours  Maternal Data - Experienced BF and breast changes with this pregnancy    Feeding Mother's Current Feeding Choice: Breast Milk   Interventions Interventions: Breast feeding basics reviewed;Education;LC Services brochure  Discharge Discharge Education: Engorgement and breast care;Warning signs for feeding baby Pump: DEBP;Manual;Personal  Consult Status Consult Status: Complete Date: 08/24/22    Kathrin Greathouse 08/24/2022, 11:48 AM

## 2022-08-27 ENCOUNTER — Inpatient Hospital Stay (HOSPITAL_COMMUNITY): Admission: AD | Admit: 2022-08-27 | Payer: 59 | Source: Home / Self Care | Admitting: Obstetrics and Gynecology

## 2022-08-27 ENCOUNTER — Inpatient Hospital Stay (HOSPITAL_COMMUNITY): Admission: RE | Admit: 2022-08-27 | Payer: 59 | Source: Ambulatory Visit

## 2022-08-30 ENCOUNTER — Telehealth (HOSPITAL_COMMUNITY): Payer: Self-pay

## 2022-08-30 NOTE — Telephone Encounter (Signed)
Patient reports feeling good. Patient has questions about healing process of the perineum. RN reviewed normal healing process of perineum. Patient declines any other questions/concerns about her health and healing.  Patient reports that baby is doing well. Eating, peeing/pooping, and gaining weight well. Baby sleeps in a bassinet. RN reviewed ABC's of safe sleep with patient. Patient declines any questions or concerns about baby.  EPDS score is 1.  Marcelino Duster [facility] 08/30/22,1510

## 2022-11-07 IMAGING — MR MR LUMBAR SPINE W/O CM
4 of 5 series · 18 of 48 positions shown · non-contrast
Comparison: None.

CLINICAL DATA: Low back pain with right leg pain and weakness for
2.5 weeks

EXAM:
MRI LUMBAR SPINE WITHOUT CONTRAST
TECHNIQUE: Multiplanar, multisequence MR imaging of the lumbar spine was
performed. No intravenous contrast was administered.

[Series 6: T2 · sagittal · 4.0mm · 0.73mm/px · 5 of 15 slices shown (1 of 2)]
[im 1/15]
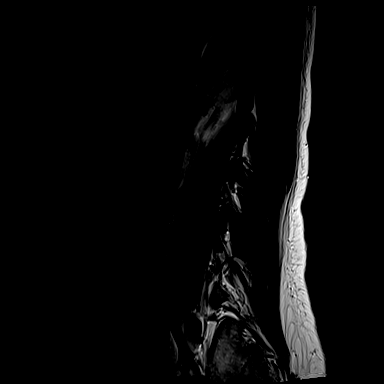
[im 4/15]
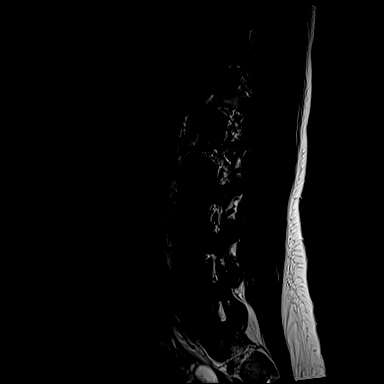
[im 8/15]
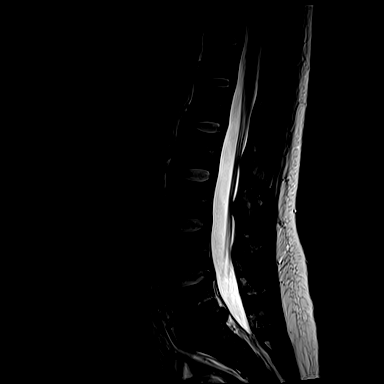
[im 11/15]
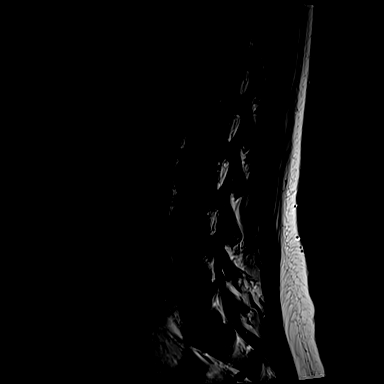
[im 15/15]
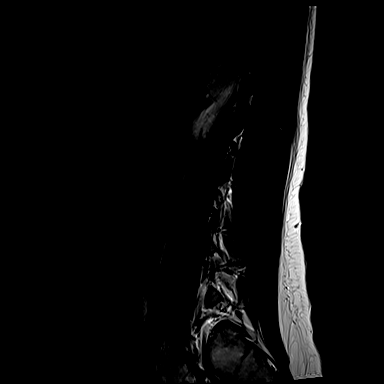

[Series 7: T1 · sagittal · 4.0mm · 0.73mm/px · 3 of 15 slices shown (1 of 2)]
[im 1/15]
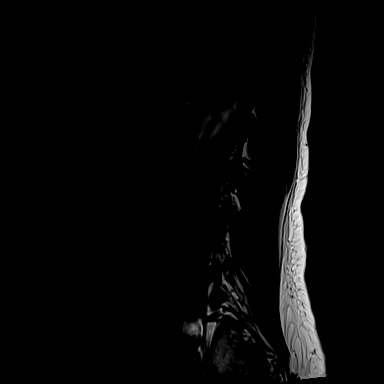
[im 8/15]
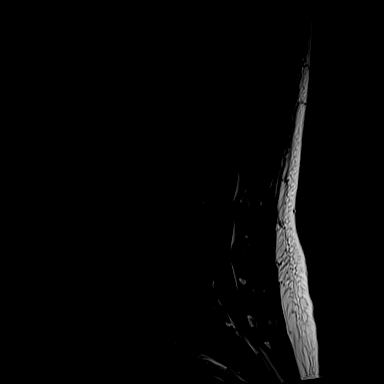
[im 15/15]
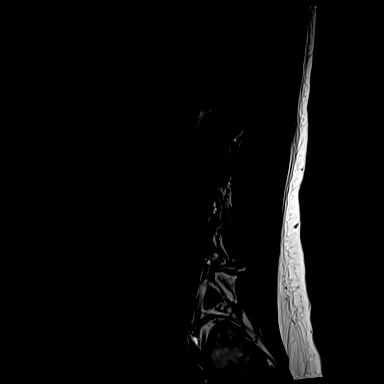

[Series 11: T1 · axial · 4.0mm · 0.28mm/px · z∈[-115,+54]mm · 3 of 42 slices shown (2 of 2)]
[im 6/42]
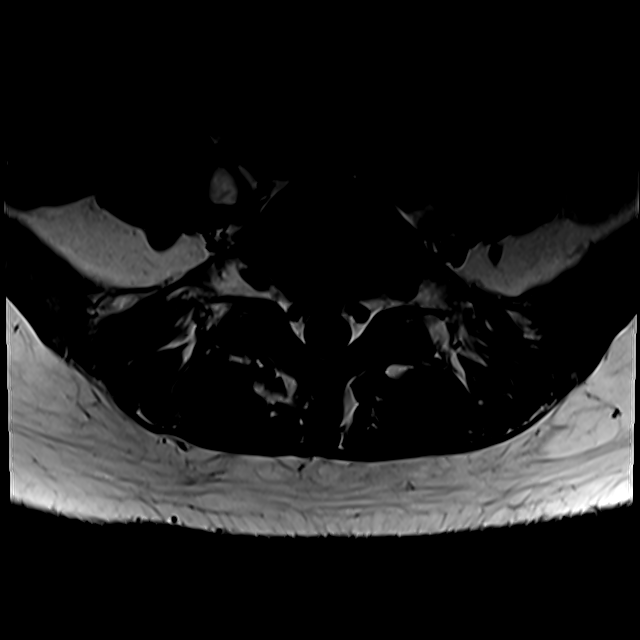
[im 22/42]
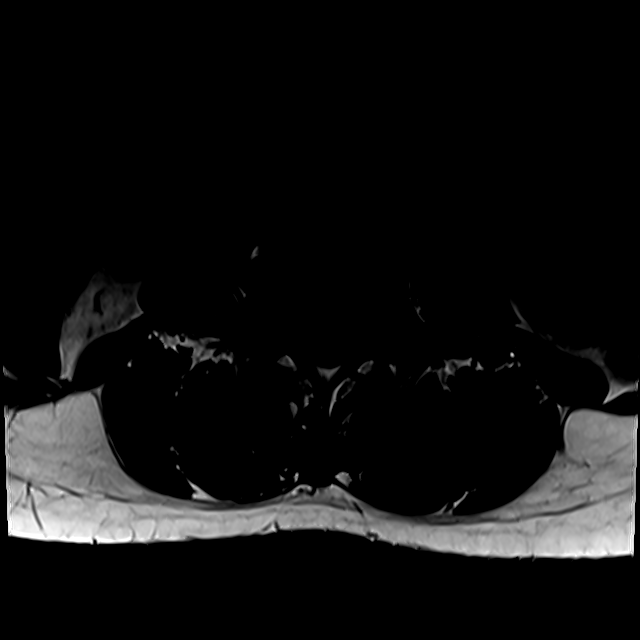
[im 36/42]
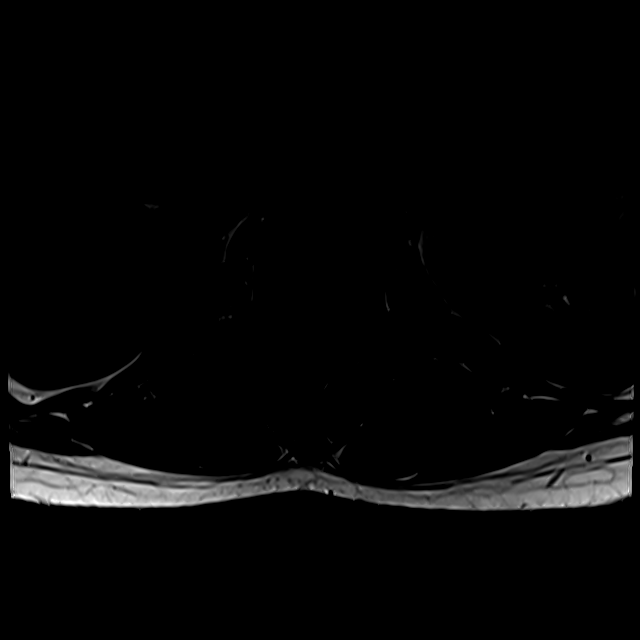

[Series 14: T2 · axial · 4.0mm · 0.28mm/px · z∈[-130,+54]mm · 7 of 42 slices shown (2 of 2)]
[im 3/42]
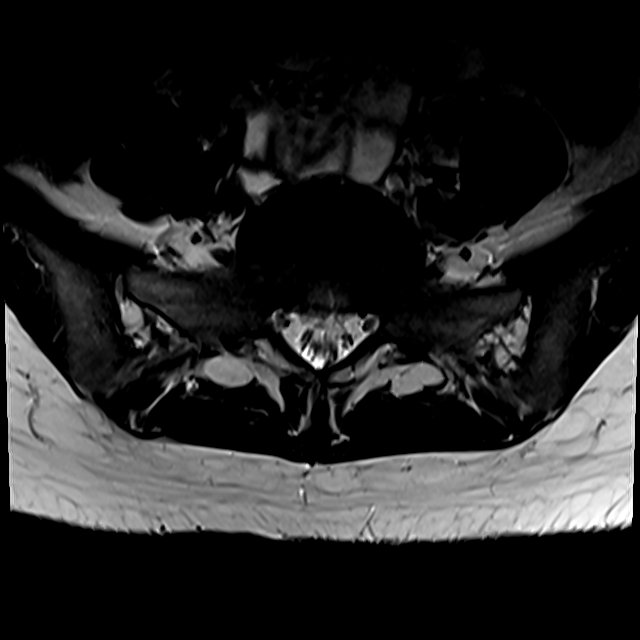
[im 6/42]
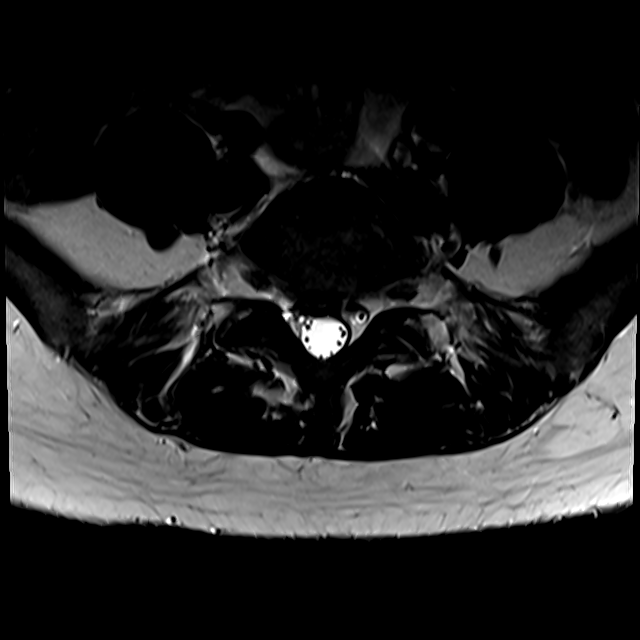
[im 9/42]
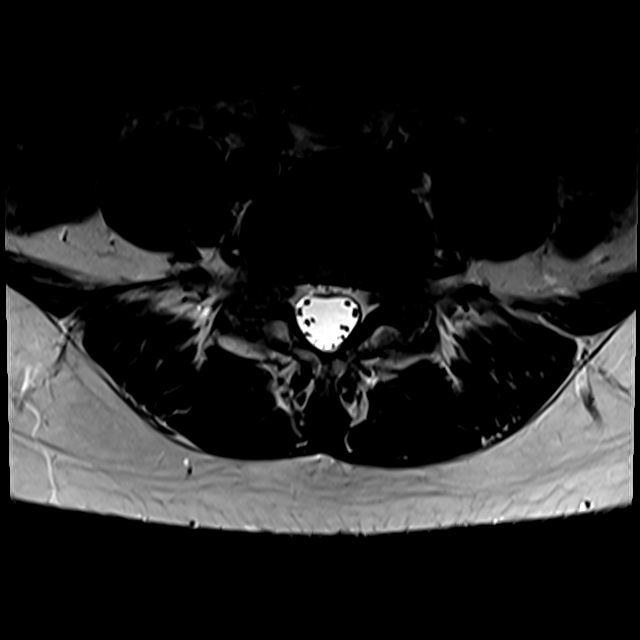
[im 14/42]
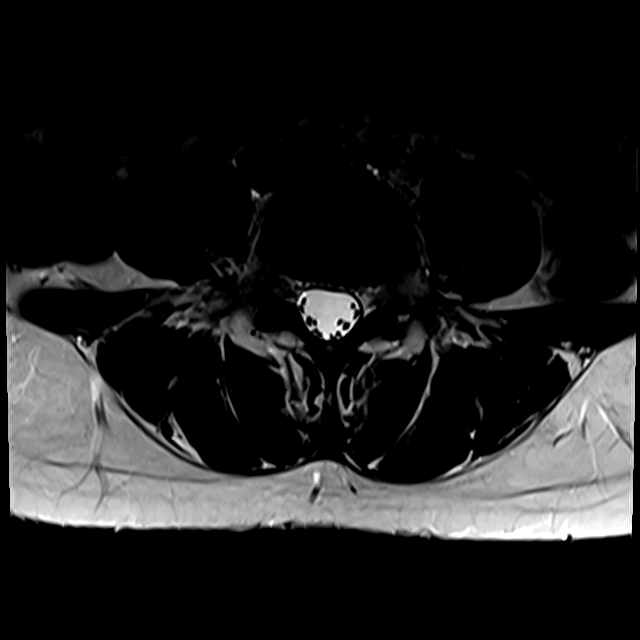
[im 20/42]
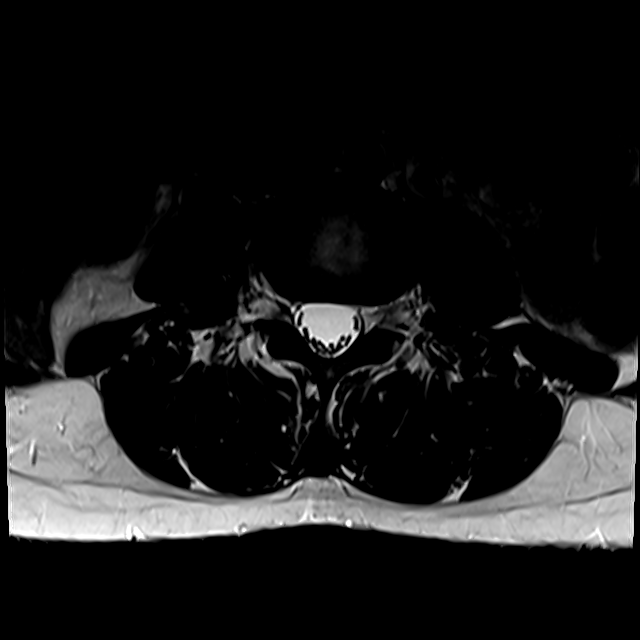
[im 22/42]
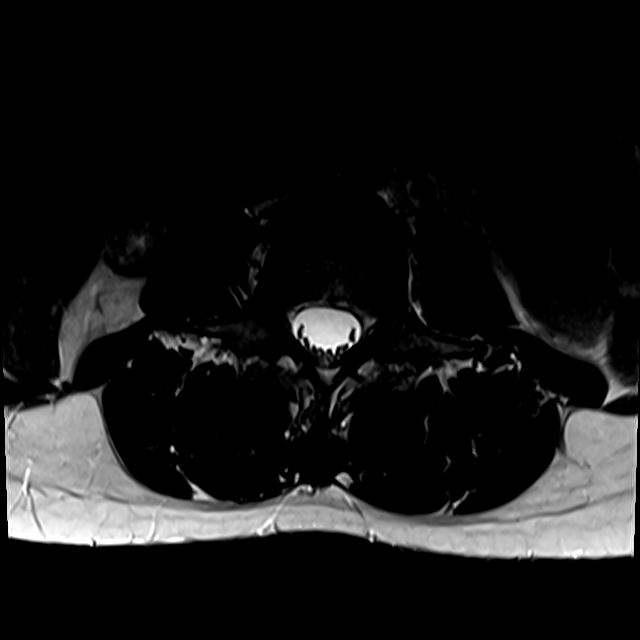
[im 36/42]
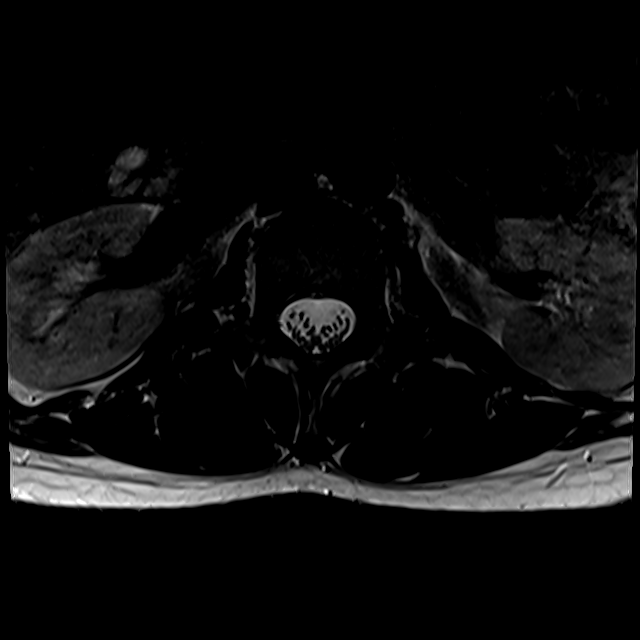

[18 of 48 positions shown; findings below may reference images not displayed]

FINDINGS: Segmentation:  Standard.

Alignment:  Physiologic.

Vertebrae:  No fracture, evidence of discitis, or bone lesion.

Conus medullaris and cauda equina: Conus extends to the L1 level.
Conus and cauda equina appear normal.

Paraspinal and other soft tissues: Negative.

Disc levels:

T12-L1: Normal.

L1-L2: Normal.

L2-L3: Normal.

L3-L4: Normal.

L4-L5: Minimal diffuse disc bulge, slightly eccentric to the left.
No foraminal or canal stenosis.

L5-S1: Disc desiccation and mild disc height loss. Focal right
paracentral disc protrusion resulting in mass effect upon the
descending right S1 nerve root (series 14, image 37). No canal or
foraminal stenosis.
IMPRESSION: 1. Focal right paracentral disc protrusion at L5-S1 resulting in
mass effect upon the descending right S1 nerve root. Correlate for
radicular symptoms in this distribution.
2. Minimal diffuse disc bulge at L4-L5.
3. No canal or foraminal stenosis at any level.
4. Remaining lumbar intervertebral disc levels are within normal
limits.

## 2023-05-26 ENCOUNTER — Ambulatory Visit (INDEPENDENT_AMBULATORY_CARE_PROVIDER_SITE_OTHER): Payer: 59 | Admitting: Family

## 2023-05-26 ENCOUNTER — Encounter: Payer: Self-pay | Admitting: Family

## 2023-05-26 VITALS — BP 102/64 | HR 88 | Temp 98.0°F | Resp 18 | Ht 68.0 in | Wt 153.0 lb

## 2023-05-26 DIAGNOSIS — R109 Unspecified abdominal pain: Secondary | ICD-10-CM

## 2023-05-26 DIAGNOSIS — Z800 Family history of malignant neoplasm of digestive organs: Secondary | ICD-10-CM | POA: Diagnosis not present

## 2023-05-26 DIAGNOSIS — R195 Other fecal abnormalities: Secondary | ICD-10-CM

## 2023-05-26 NOTE — Progress Notes (Signed)
Susan Solomon is a 37 y.o. female with the following history as recorded in [employer]:  Patient Active Problem List   Diagnosis Date Noted   Pregnancy 08/22/2022   Left lumbar radiculopathy 10/24/2020   Arrest of descent, delivered, current hospitalization 06/27/2014   Cyst of ovary 11/01/2013   Atypical squamous cell changes of undetermined significance (ASCUS) on cervical cytology with positive high risk human papilloma virus (HPV) 05/03/2013   ECZEMA, ATOPIC DERMATITIS 12/10/2006    Current Outpatient Medications  Medication Sig Dispense Refill   medroxyPROGESTERone (DEPO-PROVERA) 150 MG/ML injection Inject 150 mg into the muscle every 3 (three) months.     No current facility-administered medications for this visit.    Allergies: Patient has no known allergies.  Past Medical History:  Diagnosis Date   Abnormal Pap smear 03/29/2013   ASC-US +HPV   Infection    UTI   Vaginal Pap smear, abnormal     Past Surgical History:  Procedure Laterality Date   CESAREAN SECTION N/A 06/26/2014   Procedure: CESAREAN SECTION;  Surgeon: Marlow Baars, MD;  Location: [facility];  Service: Obstetrics;  Laterality: N/A;   NO PAST SURGERIES      Family History  Problem Relation Age of Onset   Cancer Father        colon   Cancer Paternal Aunt        colon   Cancer Paternal Uncle        colon    Social History   Tobacco Use   Smoking status: Former   Smokeless tobacco: Never   Tobacco comments:    prior to preg  Substance Use Topics   Alcohol use: No    Subjective:   Intermittent episodes of "orange colored stool"- happening on and off for the past 3-4 months; "looks like orange oil." does feel that frequency of episodes of progressing- does feel that episodes are becoming more frequent and may occur every other week now; unable to determine any specific food that triggers;  no nausea, vomiting, diarrhea; gave birth in November 2023- weight got up to 195 pounds; FH of colon cancer-  father passed away from colon cancer in his early 60s  LMP-Depo/ due this Friday     Objective:  Vitals:   05/26/23 1526  BP: 102/64  Pulse: 88  Resp: 18  Temp: 98 F (36.7 C)  TempSrc: Oral  SpO2: 99%  Weight: 153 lb (69.4 kg)  Height: 5\' 8"  (1.727 m)    General: Well developed, well nourished, in no acute distress  Skin : Warm and dry.  Head: Normocephalic and atraumatic  Lungs: Respirations unlabored; clear to auscultation bilaterally without wheeze, rales, rhonchi  CVS exam: normal rate and regular rhythm.  Abdomen: Soft; nontender; nondistended; normoactive bowel sounds; no masses or hepatosplenomegaly  Neurologic: Alert and oriented; speech intact; face symmetrical; moves all extremities well; CNII-XII intact without focal deficit   Assessment:  1. Abdominal pain, unspecified abdominal location   2. Stool discoloration   3. FH: colon cancer     Plan:   Check CBC, CMP today; will update abdominal ultrasound; refer to GI to discuss further; follow up to be determined;   No follow-ups on file.  Orders Placed This Encounter  Procedures   US Abdomen Complete    Standing Status:   Future    Standing Expiration Date:   05/25/2024    Order Specific Question:   Reason for Exam (SYMPTOM  OR DIAGNOSIS REQUIRED)    Answer:  abdominal pain/ stool discoloration    Order Specific Question:   Preferred imaging location?    Answer:   [facility]   CBC with Differential/Platelet   Comp Met (CMET)   Ambulatory referral to Gastroenterology    Referral Priority:   Routine    Referral Type:   Consultation    Referral Reason:   Specialty Services Required    Number of Visits Requested:   1    Requested Prescriptions    No prescriptions requested or ordered in this encounter

## 2023-05-29 ENCOUNTER — Ambulatory Visit
Admission: RE | Admit: 2023-05-29 | Discharge: 2023-05-29 | Disposition: A | Discharge status: Home / Self Care | Payer: 59 | Source: Ambulatory Visit | Attending: Family | Admitting: Family

## 2023-05-29 DIAGNOSIS — R195 Other fecal abnormalities: Secondary | ICD-10-CM

## 2023-06-26 ENCOUNTER — Encounter: Payer: Self-pay | Admitting: Internal Medicine

## 2023-09-25 ENCOUNTER — Ambulatory Visit (INDEPENDENT_AMBULATORY_CARE_PROVIDER_SITE_OTHER): Payer: 59 | Admitting: Internal Medicine

## 2023-09-25 ENCOUNTER — Encounter: Payer: Self-pay | Admitting: Internal Medicine

## 2023-09-25 VITALS — BP 90/60 | HR 72 | Ht 67.0 in | Wt 155.4 lb

## 2023-09-25 DIAGNOSIS — R195 Other fecal abnormalities: Secondary | ICD-10-CM | POA: Diagnosis not present

## 2023-09-25 DIAGNOSIS — Z800 Family history of malignant neoplasm of digestive organs: Secondary | ICD-10-CM

## 2023-09-25 DIAGNOSIS — R194 Change in bowel habit: Secondary | ICD-10-CM

## 2023-09-25 MED ORDER — NA SULFATE-K SULFATE-MG SULF 17.5-3.13-1.6 GM/177ML PO SOLN: 1.0000 | Freq: Once | ORAL | 0 refills | Status: AC

## 2023-09-25 NOTE — Patient Instructions (Signed)
You have been scheduled for a colonoscopy. Please follow written instructions given to you at your visit today.   Please pick up your prep supplies at the pharmacy within the next 1-3 days.  If you use inhalers (even only as needed), please bring them with you on the day of your procedure.  DO NOT TAKE 7 DAYS PRIOR TO TEST- Trulicity (dulaglutide) Ozempic, Wegovy (semaglutide) Mounjaro (tirzepatide) Bydureon Bcise (exanatide extended release)  DO NOT TAKE 1 DAY PRIOR TO YOUR TEST Rybelsus (semaglutide) Adlyxin (lixisenatide) Victoza (liraglutide) Byetta (exanatide) ___________________________________________________________________________ _______________________________________________________  If your blood pressure at your visit was 140/90 or greater, please contact your primary care physician to follow up on this.  _______________________________________________________  If you are age 51 or older, your body mass index should be between 23-30. Your Body mass index is 24.34 kg/m. If this is out of the aforementioned range listed, please consider follow up with your Primary Care Provider.  If you are age 22 or younger, your body mass index should be between 19-25. Your Body mass index is 24.34 kg/m. If this is out of the aformentioned range listed, please consider follow up with your Primary Care Provider.   ________________________________________________________  The [facility] providers would like to encourage you to use [facility] to communicate with providers for non-urgent requests or questions.  Due to long hold times on the telephone, sending your provider a message by [facility] may be a faster and more efficient way to get a response.  Please allow 48 business hours for a response.  Please remember that this is for non-urgent requests.  _______________________________________________________

## 2023-09-25 NOTE — Progress Notes (Signed)
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  Patient reports that over this year she has noticed oranges color in the toilet bowl after defecation, not necessarily more in stool.  She did see her PCP regarding this issue in August.  Blood work May 26, 2023 revealed normal comprehensive metabolic panel including liver test.  Normal CBC with hemoglobin 12.5.  She subsequently underwent abdominal ultrasound May 29, 2023.  This was unremarkable.  In terms of her family history, her father had colon cancer in his 40s.  Also, 2 paternal uncles and paternal aunt also with colon cancer.  Patient himself has not been checked.  GI review of systems is otherwise negative.  REVIEW OF SYSTEMS:  All non-GI ROS negative except for fatigue, night sweats, excessive urination  Past Medical History:  Diagnosis Date   Abnormal Pap smear 03/29/2013   ASC-US +HPV   Infection    UTI   Vaginal Pap smear, abnormal     Past Surgical History:  Procedure Laterality Date   CESAREAN SECTION N/A 06/26/2014   Procedure: CESAREAN SECTION;  Surgeon: Marlow Baars, MD;  Location: [facility];  Service: Obstetrics;  Laterality: N/A;    Social History Susan Solomon  reports that she quit smoking about 7 years ago. Her smoking use included cigarettes. She has never used smokeless tobacco. She reports current alcohol use. She reports that she does not use drugs.  family history includes Cancer - Colon in her paternal aunt; Colon cancer in her paternal uncle and paternal uncle; Colon cancer (age of onset: 34) in her father; Sickle cell trait in her brother.  No Known Allergies     PHYSICAL EXAMINATION: Vital signs: BP 90/60 (BP Location: Left Arm, Patient Position: Sitting, Cuff Size: Normal)   Pulse 72   Ht 5\' 7"  (1.702 m)   Wt 155 lb 6 oz (70.5  kg)   Breastfeeding No   BMI 24.34 kg/m   Constitutional: generally well-appearing, no acute distress Psychiatric: alert and oriented x3, cooperative Eyes: extraocular movements intact, anicteric, conjunctiva pink Mouth: oral pharynx moist, no lesions Neck: supple no lymphadenopathy Cardiovascular: heart regular rate and rhythm, no murmur Lungs: clear to auscultation bilaterally Abdomen: soft, nontender, nondistended, no obvious ascites, no peritoneal signs, normal bowel sounds, no organomegaly Rectal: Deferred until colonoscopy Extremities: no clubbing, cyanosis, or lower extremity edema bilaterally Skin: no lesions on visible extremities Neuro: No focal deficits.  Cranial nerves intact  ASSESSMENT:  1.  Orange-ish color in toilet bowl with BM.  Not particularly worrisome 2.  Strong family history of colon cancer.  Father in 51s.  Patient should have screening colonoscopy at this time   PLAN:  1.  Screening colonoscopy.The nature of the procedure, as well as the risks, benefits, and alternatives were carefully and thoroughly reviewed with the patient. Ample time for discussion and questions allowed. The patient understood, was satisfied, and agreed to proceed. 2.  Reassurance regarding stool color A total time of 45 minutes was spent preparing to see the patient, reviewing x-rays and laboratories, obtaining comprehensive history, performing medically appropriate physical examination, counseling and educating the patient regarding the above listed issues, ordering colonoscopy, and documenting clinical information in the health record

## 2023-10-09 ENCOUNTER — Encounter: Payer: Self-pay | Admitting: Internal Medicine

## 2023-10-09 ENCOUNTER — Ambulatory Visit (AMBULATORY_SURGERY_CENTER): Payer: 59 | Admitting: Internal Medicine

## 2023-10-09 VITALS — BP 100/66 | HR 56 | Temp 98.1°F | Resp 9 | Ht 67.0 in | Wt 155.6 lb

## 2023-10-09 DIAGNOSIS — R194 Change in bowel habit: Secondary | ICD-10-CM

## 2023-10-09 DIAGNOSIS — Z1211 Encounter for screening for malignant neoplasm of colon: Secondary | ICD-10-CM | POA: Diagnosis present

## 2023-10-09 DIAGNOSIS — Z800 Family history of malignant neoplasm of digestive organs: Secondary | ICD-10-CM

## 2023-10-09 MED ORDER — SODIUM CHLORIDE 0.9 % IV SOLN: 500.0000 mL | Freq: Once | INTRAVENOUS | Status: DC

## 2023-10-09 NOTE — Patient Instructions (Signed)
Discharge instructions given. Normal exam. Resume previous medications. YOU HAD AN ENDOSCOPIC PROCEDURE TODAY AT THE [facility]:   Refer to the procedure report that was given to you for any specific questions about what was found during the examination.  If the procedure report does not answer your questions, please call your gastroenterologist to clarify.  If you requested that your care partner not be given the details of your procedure findings, then the procedure report has been included in a sealed envelope for you to review at your convenience later.  YOU SHOULD EXPECT: Some feelings of bloating in the abdomen. Passage of more gas than usual.  Walking can help get rid of the air that was put into your GI tract during the procedure and reduce the bloating. If you had a lower endoscopy (such as a colonoscopy or flexible sigmoidoscopy) you may notice spotting of blood in your stool or on the toilet paper. If you underwent a bowel prep for your procedure, you may not have a normal bowel movement for a few days.  Please Note:  You might notice some irritation and congestion in your nose or some drainage.  This is from the oxygen used during your procedure.  There is no need for concern and it should clear up in a day or so.  SYMPTOMS TO REPORT IMMEDIATELY:  Following lower endoscopy (colonoscopy or flexible sigmoidoscopy):  Excessive amounts of blood in the stool  Significant tenderness or worsening of abdominal pains  Swelling of the abdomen that is new, acute  Fever of 100F or higher   For urgent or emergent issues, a gastroenterologist can be reached at any hour by calling [phone]. Do not use MyChart messaging for urgent concerns.    DIET:  We do recommend a small meal at first, but then you may proceed to your regular diet.  Drink plenty of fluids but you should avoid alcoholic beverages for 24 hours.  ACTIVITY:  You should plan to take it easy for the rest of  today and you should NOT DRIVE or use heavy machinery until tomorrow (because of the sedation medicines used during the test).    FOLLOW UP: Our staff will call the number listed on your records the next business day following your procedure.  We will call around 7:15- 8:00 am to check on you and address any questions or concerns that you may have regarding the information given to you following your procedure. If we do not reach you, we will leave a message.     If any biopsies were taken you will be contacted by phone or by letter within the next 1-3 weeks.  Please call us at [phone] if you have not heard about the biopsies in 3 weeks.    SIGNATURES/CONFIDENTIALITY: You and/or your care partner have signed paperwork which will be entered into your electronic medical record.  These signatures attest to the fact that that the information above on your After Visit Summary has been reviewed and is understood.  Full responsibility of the confidentiality of this discharge information lies with you and/or your care-partner. 

## 2023-10-09 NOTE — Progress Notes (Signed)
Pt's states no medical or surgical changes since previsit or office visit. 

## 2023-10-09 NOTE — Progress Notes (Signed)
Sedate, gd SR, tolerated procedure well, VSS, report to RN 

## 2023-10-09 NOTE — Progress Notes (Signed)
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   Patient reports that over this year she has noticed oranges color in the toilet bowl after defecation, not necessarily more in stool.  She did see her PCP regarding this issue in August.  Blood work May 26, 2023 revealed normal comprehensive metabolic panel including liver test.  Normal CBC with hemoglobin 12.5.  She subsequently underwent abdominal ultrasound May 29, 2023.  This was unremarkable.   In terms of her family history, her father had colon cancer in his 8s.  Also, 2 paternal uncles and paternal aunt also with colon cancer.  Patient himself has not been checked.  GI review of systems is otherwise negative.   REVIEW OF SYSTEMS:   All non-GI ROS negative except for fatigue, night sweats, excessive urination       Past Medical History:  Diagnosis Date   Abnormal Pap smear 03/29/2013    ASC-US +HPV   Infection      UTI   Vaginal Pap smear, abnormal                 Past Surgical History:  Procedure Laterality Date   CESAREAN SECTION N/A 06/26/2014    Procedure: CESAREAN SECTION;  Surgeon: Marlow Baars, MD;  Location: [facility];  Service: Obstetrics;  Laterality: N/A;          Social History Susan Solomon  reports that she quit smoking about 7 years ago. Her smoking use included cigarettes. She has never used smokeless tobacco. She reports current alcohol use. She reports that she does not use drugs.   family history includes Cancer - Colon in her paternal aunt; Colon cancer in her paternal uncle and paternal uncle; Colon cancer (age of onset: 30) in her father; Sickle cell trait in her brother.   Allergies  No Known Allergies         PHYSICAL EXAMINATION: Vital signs: BP 90/60 (BP Location: Left Arm, Patient Position:  Sitting, Cuff Size: Normal)   Pulse 72   Ht 5\' 7"  (1.702 m)   Wt 155 lb 6 oz (70.5 kg)   Breastfeeding No   BMI 24.34 kg/m   Constitutional: generally well-appearing, no acute distress Psychiatric: alert and oriented x3, cooperative Eyes: extraocular movements intact, anicteric, conjunctiva pink Mouth: oral pharynx moist, no lesions Neck: supple no lymphadenopathy Cardiovascular: heart regular rate and rhythm, no murmur Lungs: clear to auscultation bilaterally Abdomen: soft, nontender, nondistended, no obvious ascites, no peritoneal signs, normal bowel sounds, no organomegaly Rectal: Deferred until colonoscopy Extremities: no clubbing, cyanosis, or lower extremity edema bilaterally Skin: no lesions on visible extremities Neuro: No focal deficits.  Cranial nerves intact   ASSESSMENT:   1.  Orange-ish color in toilet bowl with BM.  Not particularly worrisome 2.  Strong family history of colon cancer.  Father in 56s.  Patient should have screening colonoscopy at this time     PLAN:   1.  Screening colonoscopy.The nature of the procedure, as well as the risks, benefits, and alternatives were carefully and thoroughly reviewed with the patient. Ample time for discussion and questions allowed. The patient understood, was satisfied, and agreed to proceed. 2.  Reassurance regarding stool color

## 2023-10-09 NOTE — Op Note (Signed)
[facility] Patient Name: Susan Solomon Procedure Date: 10/09/2023 1:26 PM MRN: [id number] Endoscopist: Wilhemina Bonito. Marina Goodell , MD, [id number] Age: 37 Referring MD:  Date of Birth: 1986/09/18 Gender: Female Account #: [id number] Procedure:                Colonoscopy Indications:              Screening in patient at increased risk: Colorectal                            cancer in father before age 32 (53) Medicines:                Monitored Anesthesia Care Procedure:                Pre-Anesthesia Assessment:                           - Prior to the procedure, a History and Physical                            was performed, and patient medications and                            allergies were reviewed. The patient's tolerance of                            previous anesthesia was also reviewed. The risks                            and benefits of the procedure and the sedation                            options and risks were discussed with the patient.                            All questions were answered, and informed consent                            was obtained. Prior Anticoagulants: The patient has                            taken no anticoagulant or antiplatelet agents. ASA                            Grade Assessment: I - A normal, healthy patient.                            After reviewing the risks and benefits, the patient                            was deemed in satisfactory condition to undergo the                            procedure.  After obtaining informed consent, the colonoscope                            was passed under direct vision. Throughout the                            procedure, the patient's blood pressure, pulse, and                            oxygen saturations were monitored continuously. The                            CF [id number] #[id number] was introduced through the anus                            and advanced to the the  cecum, identified by                            appendiceal orifice and ileocecal valve. The                            ileocecal valve, appendiceal orifice, and rectum                            were photographed. The quality of the bowel                            preparation was excellent. The colonoscopy was                            performed without difficulty. The patient tolerated                            the procedure well. The bowel preparation used was                            SUPREP via split dose instruction. Scope In: 1:44:39 PM Scope Out: 1:55:53 PM Scope Withdrawal Time: 0 hours 8 minutes 3 seconds  Total Procedure Duration: 0 hours 11 minutes 14 seconds  Findings:                 The entire examined colon appeared normal on direct                            and retroflexion views. Complications:            No immediate complications. Estimated blood loss:                            None. Estimated Blood Loss:     Estimated blood loss: none. Impression:               - The entire examined colon is normal on direct and  retroflexion views.                           - No specimens collected. Recommendation:           - Repeat colonoscopy in 5 years for screening                            purposes.                           - Patient has a contact number available for                            emergencies. The signs and symptoms of potential                            delayed complications were discussed with the                            patient. Return to normal activities tomorrow.                            Written discharge instructions were provided to the                            patient.                           - Resume previous diet.                           - Continue present medications. Wilhemina Bonito. Marina Goodell, MD 10/09/2023 2:00:03 PM This report has been signed electronically.

## 2023-10-12 ENCOUNTER — Telehealth: Payer: Self-pay

## 2023-10-12 NOTE — Telephone Encounter (Signed)
Left message on answering machine. 

## 2024-01-19 ENCOUNTER — Ambulatory Visit (HOSPITAL_BASED_OUTPATIENT_CLINIC_OR_DEPARTMENT_OTHER)
Admission: RE | Admit: 2024-01-19 | Discharge: 2024-01-19 | Disposition: A | Discharge status: Home / Self Care | Source: Ambulatory Visit | Attending: Family | Admitting: Family

## 2024-01-19 ENCOUNTER — Encounter: Payer: Self-pay | Admitting: Family

## 2024-01-19 ENCOUNTER — Ambulatory Visit (INDEPENDENT_AMBULATORY_CARE_PROVIDER_SITE_OTHER): Admitting: Family

## 2024-01-19 VITALS — BP 104/62 | HR 72 | Ht 67.0 in | Wt 156.2 lb

## 2024-01-19 DIAGNOSIS — M545 Low back pain, unspecified: Secondary | ICD-10-CM | POA: Insufficient documentation

## 2024-01-19 MED ORDER — MELOXICAM 15 MG PO TABS: 15.0000 mg | ORAL_TABLET | Freq: Every day | ORAL | 0 refills | Status: DC

## 2024-01-19 NOTE — Progress Notes (Signed)
 Susan Solomon is a 38 y.o. female with the following history as recorded in [employer]:  Patient Active Problem List   Diagnosis Date Noted   Pregnancy 08/22/2022   Left lumbar radiculopathy 10/24/2020   Arrest of descent, delivered, current hospitalization 06/27/2014   Cyst of ovary 11/01/2013   Atypical squamous cell changes of undetermined significance (ASCUS) on cervical cytology with positive high risk human papilloma virus (HPV) 05/03/2013   ECZEMA, ATOPIC DERMATITIS 12/10/2006    Current Outpatient Medications  Medication Sig Dispense Refill   medroxyPROGESTERone (DEPO-PROVERA) 150 MG/ML injection Inject 150 mg into the muscle every 3 (three) months.     meloxicam (MOBIC) 15 MG tablet Take 1 tablet (15 mg total) by mouth daily. 30 tablet 0   No current facility-administered medications for this visit.    Allergies: Patient has no known allergies.  Past Medical History:  Diagnosis Date   Abnormal Pap smear 03/29/2013   ASC-US +HPV   Infection    UTI   Vaginal Pap smear, abnormal     Past Surgical History:  Procedure Laterality Date   CESAREAN SECTION N/A 06/26/2014   Procedure: CESAREAN SECTION;  Surgeon: Marlow Baars, MD;  Location: [facility];  Service: Obstetrics;  Laterality: N/A;    Family History  Problem Relation Age of Onset   Colon cancer Father 28   Sickle cell trait Brother    Cancer - Colon Paternal Aunt        82's   Colon cancer Paternal Uncle        72's   Colon cancer Paternal Uncle        67's   Esophageal cancer Neg Hx    Stomach cancer Neg Hx    Rectal cancer Neg Hx     Social History   Tobacco Use   Smoking status: Former    Current packs/day: 0.00    Types: Cigarettes    Quit date: 2017    Years since quitting: 8.2   Smokeless tobacco: Never   Tobacco comments:    prior to preg  Substance Use Topics   Alcohol use: Yes    Comment: 1 per day, beer or wine    Subjective:   Low back pain x 3 week; "aching back pain"- no known injury  or trauma; no improvement with OTC Tylenol or Advil; history of her herniated disc in 2022; did see neurosurgery in 2022 and did do injections; symptoms improved after injections/ seemed to resolve after birth of son; has 22 yo daughter and 1 yo son;    Objective:  Vitals:   01/19/24 1303  BP: 104/62  Pulse: 72  SpO2: 100%  Weight: 156 lb 3.2 oz (70.9 kg)  Height: 5\' 7"  (1.702 m)    General: Well developed, well nourished, in no acute distress  Skin : Warm and dry.  Head: Normocephalic and atraumatic  Eyes: Sclera and conjunctiva clear; pupils round and reactive to light; extraocular movements intact  Ears: External normal; canals clear; tympanic membranes normal  Oropharynx: Pink, supple. No suspicious lesions  Neck: Supple without thyromegaly, adenopathy  Lungs: Respirations unlabored; clear to auscultation bilaterally without wheeze, rales, rhonchi  Musculoskeletal: No deformities; no active joint inflammation  Extremities: No edema, cyanosis, clubbing  Vessels: Symmetric bilaterally  Neurologic: Alert and oriented; speech intact; face symmetrical; moves all extremities well; CNII-XII intact without focal deficit   Assessment:  1. Acute low back pain without sciatica, unspecified back pain laterality     Plan:  Will update  lumbar X-ray; trial of Mobic 15 mg daily; to consider referral for PT and/ or follow up with specialist;   No follow-ups on file.  Orders Placed This Encounter  Procedures   DG Lumbar Spine Complete    Standing Status:   Future    Expiration Date:   01/18/2025    Reason for Exam (SYMPTOM  OR DIAGNOSIS REQUIRED):   low back pain    Is patient pregnant?:   No    Preferred imaging location?:   [facility]    Requested Prescriptions   Signed Prescriptions Disp Refills   meloxicam (MOBIC) 15 MG tablet 30 tablet 0    Sig: Take 1 tablet (15 mg total) by mouth daily.

## 2024-01-27 ENCOUNTER — Encounter: Payer: Self-pay | Admitting: Family

## 2024-02-15 ENCOUNTER — Other Ambulatory Visit: Payer: Self-pay | Admitting: Family

## 2024-06-13 ENCOUNTER — Ambulatory Visit (HOSPITAL_COMMUNITY)
Admission: EM | Admit: 2024-06-13 | Discharge: 2024-06-13 | Disposition: A | Discharge status: Home / Self Care | Attending: Internal Medicine | Admitting: Internal Medicine

## 2024-06-13 ENCOUNTER — Encounter (HOSPITAL_COMMUNITY): Payer: Self-pay

## 2024-06-13 DIAGNOSIS — J069 Acute upper respiratory infection, unspecified: Secondary | ICD-10-CM | POA: Diagnosis not present

## 2024-06-13 DIAGNOSIS — J040 Acute laryngitis: Secondary | ICD-10-CM | POA: Diagnosis not present

## 2024-06-13 DIAGNOSIS — J029 Acute pharyngitis, unspecified: Secondary | ICD-10-CM | POA: Diagnosis not present

## 2024-06-13 LAB — POCT RAPID STREP A (OFFICE): Rapid Strep A Screen: NEGATIVE

## 2024-06-13 MED ORDER — BENZONATATE 200 MG PO CAPS: 200.0000 mg | ORAL_CAPSULE | Freq: Three times a day (TID) | ORAL | 0 refills | Status: DC | PRN

## 2024-06-13 NOTE — ED Provider Notes (Signed)
 [facility]    CSN: [id number] Arrival date & time: 06/13/24  1556      History   Chief Complaint Chief Complaint  Patient presents with   Cough   Sore Throat   Generalized Body Aches   Hoarse    HPI REISHA WOS is a 38 y.o. female who presents with onset of ST, non productive cough, body aches  that started 6 days ago. The body aches only lasted 2 days. Then 3 days ago she started losing her voice. Her throat is still sore and hurts to swallow. Has been fatigued and has had decreased appetite.  She denies  fever, sweats, or SOB. She did not test herself for Covid.    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 Authorizing Provider  benzonatate  (TESSALON ) 200 MG capsule Take 1 capsule (200 mg total) by mouth 3 (three) times daily as needed for cough. 06/13/24   Rodriguez-Southworth, Kyra, PA-C    Family History Family History  Problem Relation Age of Onset   Colon cancer Father  72   Sickle cell trait Brother    Cancer - Colon Paternal Aunt        50's   Colon cancer Paternal Uncle        50's   Colon cancer Paternal Uncle        50's   Esophageal cancer Neg Hx    Stomach cancer Neg Hx    Rectal cancer Neg Hx     Social History Social History   Tobacco Use   Smoking status: Former    Current packs/day: 0.00    Types: Cigarettes    Quit date: 2017    Years since quitting: 8.6   Smokeless tobacco: Never   Tobacco comments:    prior to preg  Vaping Use   Vaping status: Never Used  Substance Use Topics   Alcohol use: Yes   Drug use: No     Allergies   Patient has no known allergies.   Review of Systems Review of Systems As noted in HPI  Physical Exam Triage Vital Signs ED Triage Vitals  Encounter Vitals Group     BP 06/13/24 1652  103/69     Girls Systolic BP Percentile --      Girls Diastolic BP Percentile --      Boys Systolic BP Percentile --      Boys Diastolic BP Percentile --      Pulse Rate 06/13/24 1652 77     Resp 06/13/24 1652 14     Temp 06/13/24 1652 98.6 F (37 C)     Temp Source 06/13/24 1652 Oral     SpO2 06/13/24 1652 96 %     Weight --      Height --      Head Circumference --      Peak Flow --      Pain Score 06/13/24 1651 7     Pain Loc --      Pain Education --      Exclude from Growth Chart --    No data found.  Updated Vital Signs BP 103/69 (BP Location: Left Arm)   Pulse 77   Temp 98.6 F (37 C) (Oral)   Resp 14   LMP 05/22/2024 (Exact Date)   SpO2 96%   Visual Acuity Right Eye Distance:   Left Eye Distance:   Bilateral Distance:    Right Eye Near:   Left Eye Near:    Bilateral Near:     Physical Exam Vitals and nursing note reviewed.  Constitutional:      General: She is not in acute distress.    Appearance: She is normal weight. She is not toxic-appearing.     Comments: Her voice is a little hoarsed  HENT:     Right Ear: Tympanic membrane and ear canal normal.     Left Ear:  Tympanic membrane and ear canal normal.     Mouth/Throat:     Mouth: Mucous membranes are moist.     Pharynx: Uvula midline. Posterior oropharyngeal erythema present. No pharyngeal swelling.     Tonsils: 2+ on the right. 2+ on the left.  Cardiovascular:     Rate and Rhythm: Normal rate and regular rhythm.     Heart sounds: No murmur heard. Pulmonary:     Effort: Pulmonary effort is normal.     Breath sounds: Normal breath sounds.  Musculoskeletal:        General: Normal range of motion.     Cervical back: Neck supple.  Lymphadenopathy:     Cervical: Cervical adenopathy present.  Skin:    General: Skin is warm and dry.  Neurological:     Mental Status: She is alert and oriented to person, place, and time.     Gait: Gait normal.  Psychiatric:        Mood and Affect: Mood normal.        Behavior: Behavior normal.        Thought Content: Thought content normal.        Judgment: Judgment normal.      UC Treatments / Results  Labs (all labs ordered are listed, but only abnormal results are displayed) Labs Reviewed  POCT RAPID STREP A (OFFICE)    EKG   Radiology No results found.  Procedures Procedures (including critical care time)  Medications Ordered in UC Medications - No data to display  Initial Impression / Assessment and Plan / UC Course  I have reviewed the triage vital signs and the nursing notes.  Pertinent labs  results that were available during my care of the patient were reviewed by me and considered in my medical  decision making (see chart for details).  URI Viral pharyngitis Laryngitis  She was placed on Tessalon  as noted Needs to rest voice See instructions.    Final Clinical Impressions(s) / UC Diagnoses   Final diagnoses:  Acute pharyngitis, unspecified etiology  Laryngitis  Upper respiratory tract infection, unspecified type     Discharge Instructions      Your strep test is negative You have viral laryngitis and cold. Your  vocal cords are irritated from the cough and drainage and will get better with time. Avoid too much talking when possible.  Your exam does not show a bacterial infection, so antibiotics will not help this.   You may do saline nose rinses twice a day for 3-5 days to clean up the post nasal drainage and stuffy nose.      ED Prescriptions     Medication Sig Dispense Auth. Provider   benzonatate  (TESSALON ) 200 MG capsule  (Status: Discontinued) Take 1 capsule (200 mg total) by mouth 3 (three) times daily as needed for cough. 30 capsule Rodriguez-Southworth, Rainey Kahrs, PA-C   benzonatate  (TESSALON ) 200 MG capsule Take 1 capsule (200 mg total) by mouth 3 (three) times daily as needed for cough. 30 capsule Rodriguez-Southworth, Quinterius Gaida, PA-C      PDMP not reviewed this encounter.   Lindi Carter, PA-C 06/13/24 1743

## 2024-06-13 NOTE — ED Triage Notes (Signed)
 Patient c/o a non productive cough, sore throat, body aches, and hoarseness x 6 days.   Patient states she has been taking Mucinex and cough drops for her symptoms.

## 2024-06-13 NOTE — Discharge Instructions (Addendum)
 Your strep test is negative You have viral laryngitis and cold. Your vocal cords are irritated from the cough and drainage and will get better with time. Avoid too much talking when possible.  Your exam does not show a bacterial infection, so antibiotics will not help this.   You may do saline nose rinses twice a day for 3-5 days to clean up the post nasal drainage and stuffy nose.

## 2024-10-07 ENCOUNTER — Inpatient Hospital Stay (HOSPITAL_COMMUNITY)

## 2024-10-07 ENCOUNTER — Inpatient Hospital Stay (HOSPITAL_COMMUNITY)
Admission: AD | Admit: 2024-10-07 | Discharge: 2024-10-07 | Disposition: A | Discharge status: Home / Self Care | Attending: Obstetrics and Gynecology | Admitting: Obstetrics and Gynecology

## 2024-10-07 ENCOUNTER — Encounter (HOSPITAL_COMMUNITY): Payer: Self-pay | Admitting: *Deleted

## 2024-10-07 DIAGNOSIS — O0330 Unspecified complication following incomplete spontaneous abortion: Secondary | ICD-10-CM | POA: Diagnosis present

## 2024-10-07 DIAGNOSIS — R9389 Abnormal findings on diagnostic imaging of other specified body structures: Secondary | ICD-10-CM | POA: Diagnosis not present

## 2024-10-07 DIAGNOSIS — O039 Complete or unspecified spontaneous abortion without complication: Secondary | ICD-10-CM

## 2024-10-07 LAB — TYPE AND SCREEN
ABO/RH(D): O POS
Antibody Screen: NEGATIVE

## 2024-10-07 LAB — CBC
HCT: 36.2 % (ref 36.0–46.0)
Hemoglobin: 11.4 g/dL — ABNORMAL LOW (ref 12.0–15.0)
MCH: 26.3 pg (ref 26.0–34.0)
MCHC: 31.5 g/dL (ref 30.0–36.0)
MCV: 83.6 fL (ref 80.0–100.0)
Platelets: 266 K/uL (ref 150–400)
RBC: 4.33 MIL/uL (ref 3.87–5.11)
RDW: 13.9 % (ref 11.5–15.5)
WBC: 7.4 K/uL (ref 4.0–10.5)
nRBC: 0 % (ref 0.0–0.2)

## 2024-10-07 LAB — HCG, QUANTITATIVE, PREGNANCY: hCG, Beta Chain, Quant, S: 5648 m[IU]/mL — ABNORMAL HIGH

## 2024-10-07 MED ORDER — IBUPROFEN 800 MG PO TABS: 800.0000 mg | ORAL_TABLET | Freq: Three times a day (TID) | ORAL | 1 refills | Status: AC | PRN

## 2024-10-07 MED ORDER — LACTATED RINGERS IV BOLUS
1000.0000 mL | Freq: Once | INTRAVENOUS | Status: AC
  Administered 2024-10-07: 1000 mL via INTRAVENOUS

## 2024-10-07 MED ORDER — DOXYCYCLINE HYCLATE 100 MG IV SOLR
200.0000 mg | INTRAVENOUS | Status: AC
  Administered 2024-10-07: 200 mg via INTRAVENOUS
  Filled 2024-10-07: qty 200

## 2024-10-07 MED ORDER — LACTATED RINGERS IV BOLUS: 1000.0000 mL | Freq: Once | INTRAVENOUS | Status: DC

## 2024-10-07 NOTE — MAU Note (Addendum)
 Susan Solomon is a 38 y.o. at [redacted]w[redacted]d here in MAU reporting: started spotting last night . Go heavier arouns 12:30 passing clots  and cramping. And feels dizzy. Had u/s on 12/22 and was told that there was no heartbeat and she ws having a miscarriage. F/u appointment on 12/30 to make decisions.   LMP: 07/29/2024 Onset of complaint: 12:30 Pain score: 6 Vitals:   10/07/24 1737  BP: (!) 83/56  Pulse: 77  Resp: 18  Temp: 98.1 F (36.7 C)     FHT: n/a  Lab orders placed from triage: HCG .

## 2024-10-07 NOTE — MAU Note (Signed)
 Assisted Dr. Sudie with pelvic exam. Several large clots removed and gestational sac . Pt tolerated well.

## 2024-10-07 NOTE — MAU Provider Note (Addendum)
 MAU PROVIDER NOTE  S: Patient presents today with complaints of heavy vaginal bleeding in the setting of pregnancy of unknown viability.  Patient was seen on December 22 with sure LMP at 9 weeks 3 days.  At that visit, transvaginal ultrasound revealed gestational sac measuring 2.27 cm without fetal pole or yolk sac.  No cramping or bleeding at that time.  Patient had been counseled at that time for possible nonviable pregnancy and repeat scan was followed outpatient.  However, this morning patient began having heavy menstrual flow and passing several large clots while in the shower.  Clots made patient feel lightheaded so she called and was instructed to present to MAU.  Has some mild lower abdominal cramping rating 4 out of 10 for pain.  Denies nausea or vomiting, fevers or chills.  Currently has IV in place and is receiving 1 L fluid bolus  O: BP (!) 83/56   Pulse 77   Temp 98.1 F (36.7 C)   Resp 18   LMP 07/29/2024  Gen: NAD, sitting up in bed CV: CTAB, RRR Abd: Soft, NTTP GU (Dr Cleatus assist): Multiple fox swabs of bright red blood evacuated from vaginal vault upon insertion of speculum.  Multiple large clots evacuated afterwards.  Upon visualization of external os, approximately 1 cm dilated with products of conception visible.  Ring forceps used to gently tease out specimen from endocervical canal.  Passed off to pathology.  Consented patient for IV doxycycline  given cervical manipulation.  Bedside ultrasound after examination shows no evidence of remaining gestational sac.   Total EBL via Triton 329 cc H/H/plt 11.4/36.2/266K, serum beta 5648. Opos based on prior [facility]  H/o csx followed by VBAC  A/P: This is a 38 year old G3 P2-0-0-2 at 10 weeks pressure LMP presenting with incomplete AB.  As per prior exam, products of conception evacuated from cervical os.  Patient feels improved from cramping but overall fatigued.  Will receive an additional liter of fluid.  To confirm passage of sac,  transvaginal ultrasound ordered.  As mentioned above, IV doxycycline  200 mg once ordered given manipulation with ring forceps.  Follow-up after ultrasound  ADDENDUM 2110 - TVUS without evidence of retained gestational sac, EMS 2cm which is appropriate for setting. Minimal cramping and bleeding per patient.  BP (!) 92/51   Pulse 64   Temp 98.1 F (36.7 C)   Resp 18   LMP 07/29/2024   SpO2 100%  Given stable status, DC home at this time, will Rx ibuprofen  800mg  TID 30tab and plan for outpatient f/u in 2wks. Bleeding precautions given

## 2024-10-13 LAB — SURGICAL PATHOLOGY
